# Patient Record
Sex: Male | Born: 1985 | Race: White | Hispanic: No | Smoking: Never smoker
Health system: Southern US, Community
[De-identification: ages and names within clinical notes are randomized; demographics above are authoritative.]

## PROBLEM LIST (undated history)

## (undated) DIAGNOSIS — E7800 Pure hypercholesterolemia, unspecified: Secondary | ICD-10-CM

## (undated) DIAGNOSIS — M545 Low back pain, unspecified: Secondary | ICD-10-CM

## (undated) DIAGNOSIS — Z9989 Dependence on other enabling machines and devices: Secondary | ICD-10-CM

## (undated) DIAGNOSIS — I10 Essential (primary) hypertension: Secondary | ICD-10-CM

## (undated) DIAGNOSIS — G8929 Other chronic pain: Secondary | ICD-10-CM

## (undated) DIAGNOSIS — E119 Type 2 diabetes mellitus without complications: Secondary | ICD-10-CM

## (undated) DIAGNOSIS — F411 Generalized anxiety disorder: Secondary | ICD-10-CM

## (undated) DIAGNOSIS — A4902 Methicillin resistant Staphylococcus aureus infection, unspecified site: Secondary | ICD-10-CM

## (undated) DIAGNOSIS — R000 Tachycardia, unspecified: Secondary | ICD-10-CM

## (undated) DIAGNOSIS — G4733 Obstructive sleep apnea (adult) (pediatric): Secondary | ICD-10-CM

## (undated) HISTORY — DX: Morbid (severe) obesity due to excess calories: E66.01

## (undated) HISTORY — DX: Essential (primary) hypertension: I10

## (undated) HISTORY — DX: Low back pain, unspecified: M54.50

## (undated) HISTORY — DX: Tachycardia, unspecified: R00.0

## (undated) HISTORY — DX: Methicillin resistant Staphylococcus aureus infection, unspecified site: A49.02

## (undated) HISTORY — DX: Generalized anxiety disorder: F41.1

## (undated) HISTORY — DX: Low back pain: M54.5

## (undated) HISTORY — DX: Dependence on other enabling machines and devices: Z99.89

## (undated) HISTORY — DX: Pure hypercholesterolemia, unspecified: E78.00

## (undated) HISTORY — DX: Obstructive sleep apnea (adult) (pediatric): G47.33

## (undated) HISTORY — DX: Type 2 diabetes mellitus without complications: E11.9

## (undated) HISTORY — DX: Other chronic pain: G89.29

---
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
## 2005-01-28 ENCOUNTER — Emergency Department (HOSPITAL_COMMUNITY): Admission: EM | Admit: 2005-01-28 | Discharge: 2005-01-28 | Payer: Self-pay | Admitting: Emergency Medicine

## 2005-08-07 ENCOUNTER — Emergency Department (HOSPITAL_COMMUNITY): Admission: EM | Admit: 2005-08-07 | Discharge: 2005-08-07 | Payer: Self-pay | Admitting: Emergency Medicine

## 2007-03-28 ENCOUNTER — Emergency Department (HOSPITAL_COMMUNITY): Admission: EM | Admit: 2007-03-28 | Discharge: 2007-03-29 | Payer: Self-pay | Admitting: Emergency Medicine

## 2007-08-06 ENCOUNTER — Emergency Department (HOSPITAL_COMMUNITY): Admission: EM | Admit: 2007-08-06 | Discharge: 2007-08-06 | Payer: Self-pay | Admitting: Family Medicine

## 2007-08-15 ENCOUNTER — Emergency Department (HOSPITAL_COMMUNITY): Admission: EM | Admit: 2007-08-15 | Discharge: 2007-08-15 | Payer: Self-pay | Admitting: Emergency Medicine

## 2008-12-17 IMAGING — CR DG CHEST 2V
2 series · 2 of 2 positions shown · non-contrast
Comparison: none

CLINICAL DATA: Left upper chest pain.  Hypertension. 
 CHEST - 2 VIEW:

[w chest pa]
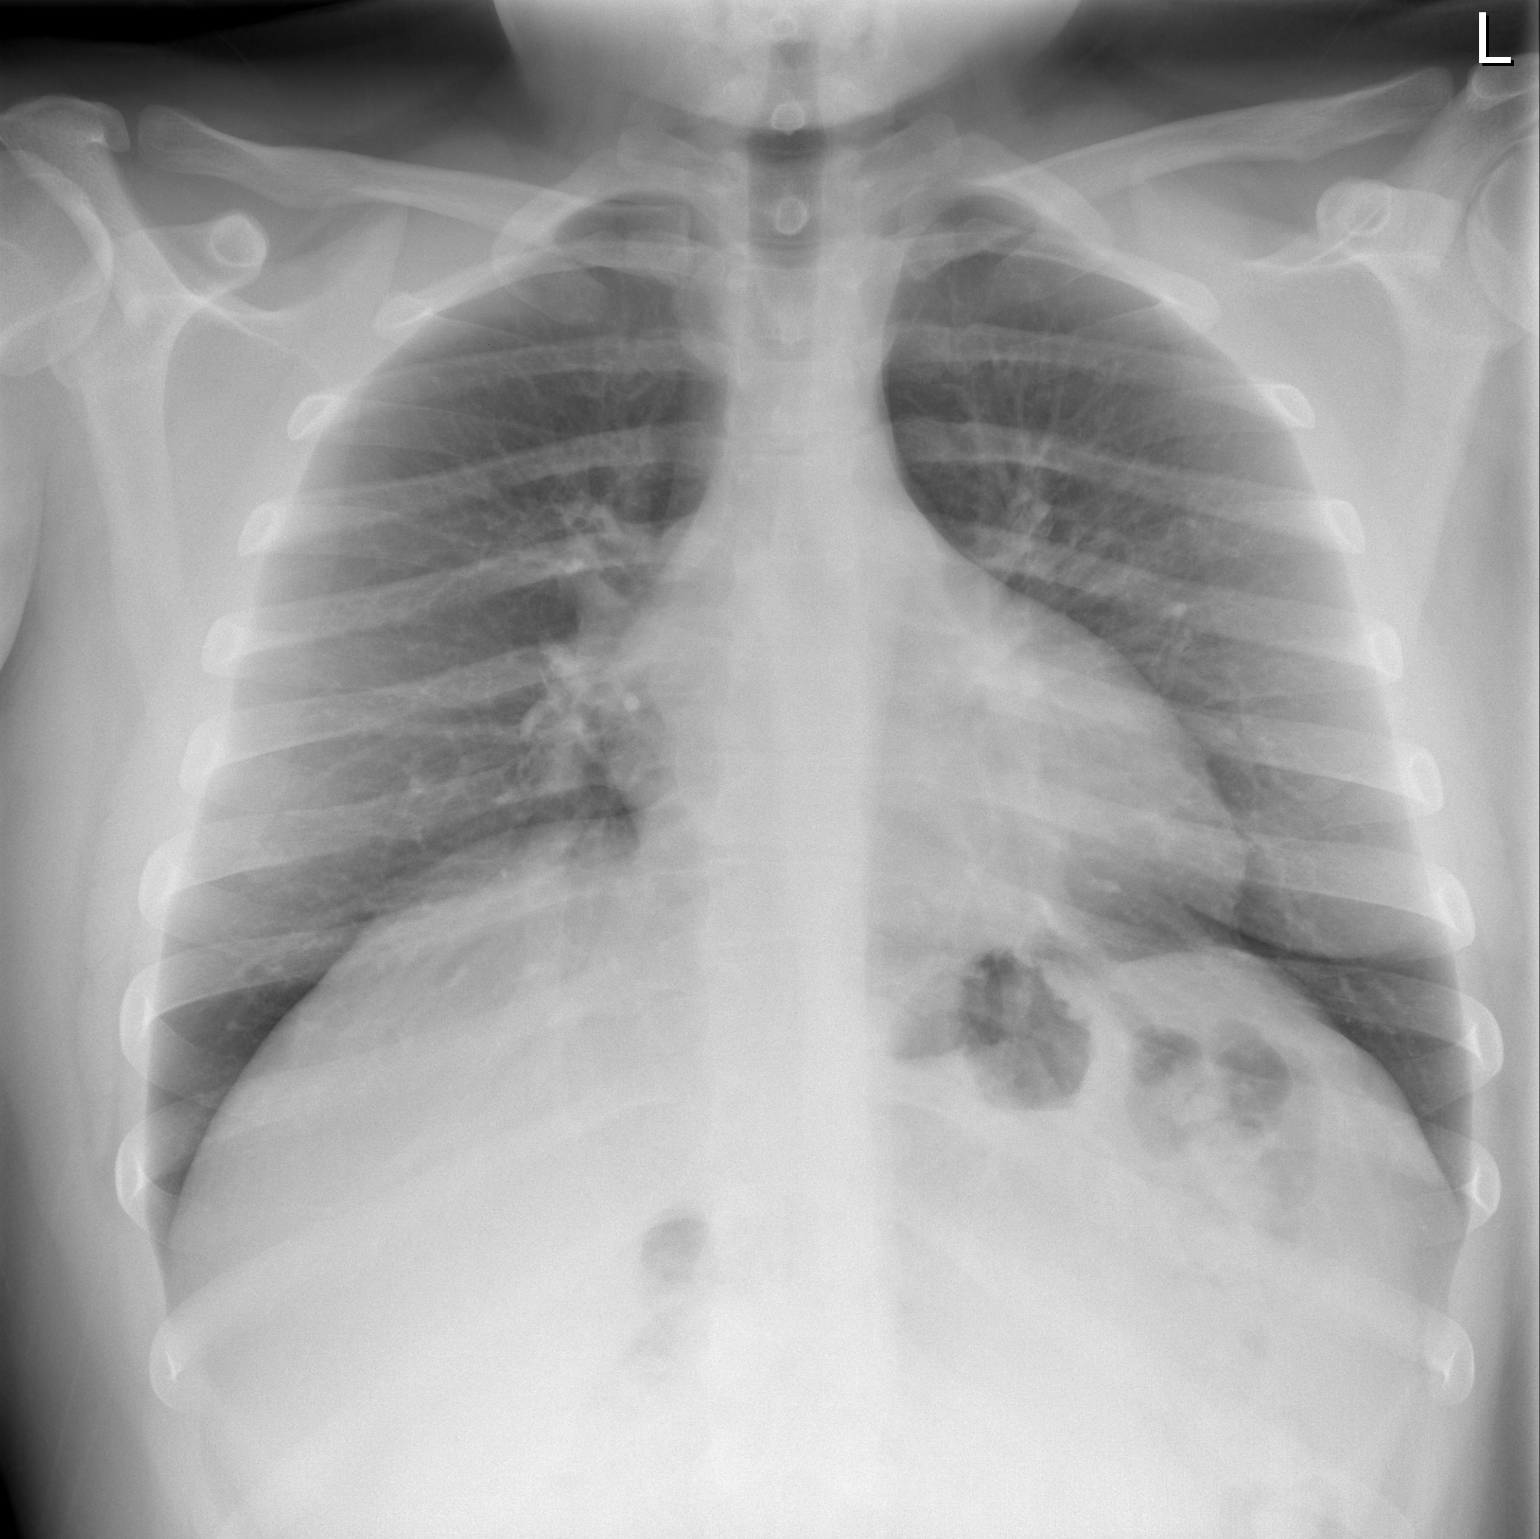

[w chest lat]
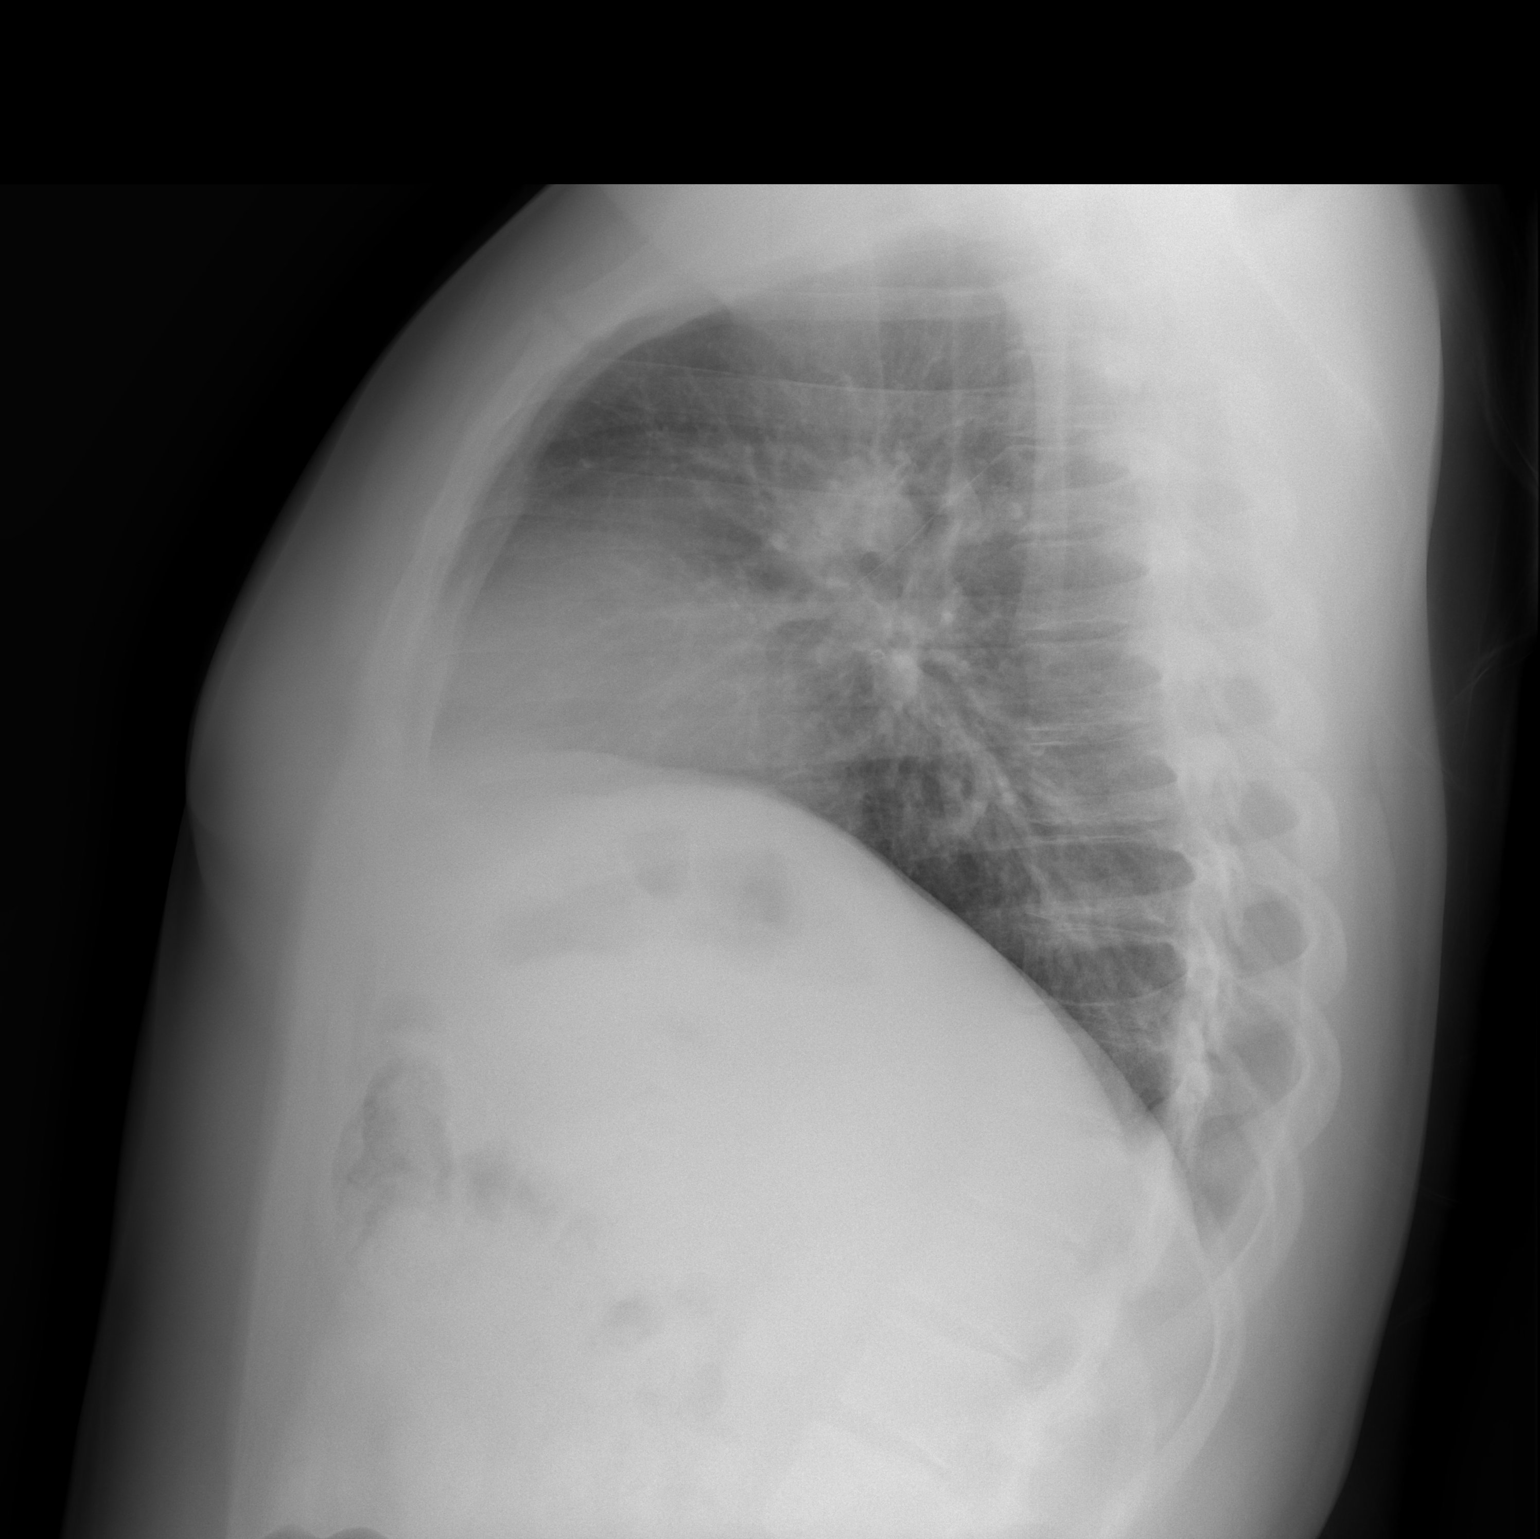

[2 of 2 positions shown; findings below may reference images not displayed]

FINDINGS: Two views of the chest show the lungs to be clear.  Mild peribronchial thickening is noted.  The heart is within normal limits in size.
IMPRESSION: No pneumonia.  Mild peribronchial thickening.

## 2011-02-10 LAB — RAPID STREP SCREEN (MED CTR MEBANE ONLY): Streptococcus, Group A Screen (Direct): NEGATIVE

## 2011-02-10 LAB — URINALYSIS, ROUTINE W REFLEX MICROSCOPIC
Bilirubin Urine: NEGATIVE
Glucose, UA: NEGATIVE
Hgb urine dipstick: NEGATIVE
Ketones, ur: NEGATIVE
Nitrite: NEGATIVE
Protein, ur: NEGATIVE
Specific Gravity, Urine: 1.024
Urobilinogen, UA: 1
pH: 6

## 2011-02-10 LAB — COMPREHENSIVE METABOLIC PANEL
ALT: 25
AST: 18
Albumin: 4.1
Alkaline Phosphatase: 52
BUN: 10
CO2: 28
Calcium: 9.1
Chloride: 100
Creatinine, Ser: 0.91
GFR calc Af Amer: >60
GFR calc non Af Amer: >60
Glucose, Bld: 104 — ABNORMAL HIGH
Potassium: 3.6
Sodium: 136
Total Bilirubin: 0.9
Total Protein: 7.3

## 2011-02-10 LAB — POCT CARDIAC MARKERS
CKMB, poc: <1 — ABNORMAL LOW
Myoglobin, poc: 83.5
Operator id: 1192
Troponin i, poc: <0.05

## 2011-02-10 LAB — CBC
HCT: 40.4
Hemoglobin: 13.9
MCHC: 34.3
MCV: 86.4
Platelets: 338
RBC: 4.67
RDW: 11.9
WBC: 11.1 — ABNORMAL HIGH

## 2011-02-10 LAB — DIFFERENTIAL
Basophils Absolute: 0
Basophils Relative: 0
Eosinophils Absolute: 0.1 — ABNORMAL LOW
Eosinophils Relative: 1
Lymphocytes Relative: 30
Lymphs Abs: 3.3
Monocytes Absolute: 0.8
Monocytes Relative: 7
Neutro Abs: 6.9
Neutrophils Relative %: 61

## 2011-02-10 LAB — D-DIMER, QUANTITATIVE: D-Dimer, Quant: <0.22

## 2011-02-10 LAB — URINE MICROSCOPIC-ADD ON

## 2011-07-15 ENCOUNTER — Ambulatory Visit: Payer: Self-pay | Admitting: Family Medicine

## 2011-07-15 ENCOUNTER — Ambulatory Visit: Payer: Self-pay

## 2011-07-15 VITALS — BP 135/87 | HR 87 | Temp 100.0°F | Resp 16 | Ht 68.75 in | Wt 268.0 lb

## 2011-07-15 DIAGNOSIS — R05 Cough: Secondary | ICD-10-CM

## 2011-07-15 DIAGNOSIS — R059 Cough, unspecified: Secondary | ICD-10-CM

## 2011-07-15 DIAGNOSIS — J029 Acute pharyngitis, unspecified: Secondary | ICD-10-CM

## 2011-07-15 LAB — POCT RAPID STREP A (OFFICE): Rapid Strep A Screen: NEGATIVE

## 2011-07-15 MED ORDER — HYDROCODONE-HOMATROPINE 5-1.5 MG/5ML PO SYRP: 5.0000 mL | ORAL_SOLUTION | Freq: Three times a day (TID) | ORAL | Status: AC | PRN

## 2011-07-15 MED ORDER — CEFDINIR 300 MG PO CAPS: 300.0000 mg | ORAL_CAPSULE | Freq: Two times a day (BID) | ORAL | Status: AC

## 2011-07-15 NOTE — Progress Notes (Signed)
  Patient Name: Eric Hardy Date of Birth: 1985-10-12 Medical Record Number: [id number] Gender: male Date of Encounter: 07/15/2011  History of Present Illness:  Eric Hardy is a 26 y.o. very pleasant male patient who presents with the following:  He has been "coughing up green stuff" for 3 weeks, and he also has a right earache for the last 3 or 4 days.  Also notes severe ST an difficulty swallowing about 2 days ago- his neck/ nodes are also very tender. Had not been aware of any fever- no GI symptoms. His 23 and 19 year old children had strep throat last week.  He is having difficulty swallowing due to pain, but is able to breathe ok.  Otherwise generally healthy  There is no problem list on file for this patient.  History reviewed. No pertinent past medical history. History reviewed. No pertinent past surgical history. History  Substance Use Topics  . Smoking status: Never Smoker   . Smokeless tobacco: Never Used  . Alcohol Use: Yes     socially   History reviewed. No pertinent family history. No Known Allergies  Medication list has been reviewed and updated.  Review of Systems: As per HPI- otherwise negative.   Physical Examination: Filed Vitals:   07/15/11 1346  BP: 135/87  Pulse: 87  Temp: 100 F (37.8 C)  TempSrc: Oral  Resp: 16  Height: 5' 8.75" (1.746 m)  Weight: 268 lb (121.564 kg)    Body mass index is 39.87 kg/(m^2).  GEN: WDWN, NAD, Non-toxic, A & O x 3, obese HEENT: Atraumatic, Normocephalic. Neck supple. No masses, No LAD.  TM wnl bilaterally, enlarged tonsils bilaterally.  Tender cervical and submandibular nodes bilaterally.   His oropharynx is not obstructed.  Ears and Nose: No external deformity. CV: RRR, No M/G/R. No JVD. No thrill. No extra heart sounds. PULM: CTA B, no wheezes, crackles, rhonchi. No retractions. No resp. distress. No accessory muscle use. EXTR: No c/c/e NEURO Normal gait.  PSYCH: Normally interactive. Conversant. Not  depressed or anxious appearing.  Calm demeanor.   Given 400mg  of liquid ibuprofen while at clinic  Results for orders placed in visit on 07/15/11  POCT RAPID STREP A (OFFICE)      Component Value Range   Rapid Strep A Screen Negative  Negative     Assessment and Plan: 1. Sore throat  POCT rapid strep A, cefdinir (OMNICEF) 300 MG capsule, HYDROcodone-homatropine (HYCODAN) 5-1.5 MG/5ML syrup  2. Cough     Suspect that Tyjai may have strep- he also may have a bacterial bronchitis.  Will treat as above with omnicef to cover both.  Did not sent throat culture due to patient finances.  Hycodan for cough and pain.  Patient (or parent if minor) instructed to return to clinic or call if not better in 2 day(s).  If he has any trouble breathing due to throat swelling he is to proceed to the ER or call 911- he agrees

## 2012-05-20 ENCOUNTER — Encounter (HOSPITAL_COMMUNITY): Payer: Self-pay | Admitting: *Deleted

## 2012-05-20 ENCOUNTER — Emergency Department (HOSPITAL_COMMUNITY)
Admission: EM | Admit: 2012-05-20 | Discharge: 2012-05-20 | Disposition: A | Discharge status: Home / Self Care | Payer: Self-pay | Attending: Emergency Medicine | Admitting: Emergency Medicine

## 2012-05-20 DIAGNOSIS — M79609 Pain in unspecified limb: Secondary | ICD-10-CM | POA: Insufficient documentation

## 2012-05-20 DIAGNOSIS — M79661 Pain in right lower leg: Secondary | ICD-10-CM

## 2012-05-20 DIAGNOSIS — IMO0001 Reserved for inherently not codable concepts without codable children: Secondary | ICD-10-CM | POA: Insufficient documentation

## 2012-05-20 DIAGNOSIS — M7989 Other specified soft tissue disorders: Secondary | ICD-10-CM | POA: Insufficient documentation

## 2012-05-20 LAB — CBC WITH DIFFERENTIAL/PLATELET
Basophils Absolute: 0 10*3/uL (ref 0.0–0.1)
Basophils Relative: 0 % (ref 0–1)
Eosinophils Absolute: 0.1 10*3/uL (ref 0.0–0.7)
Eosinophils Relative: 1 % (ref 0–5)
HCT: 41.2 % (ref 39.0–52.0)
Hemoglobin: 14.3 g/dL (ref 13.0–17.0)
Lymphocytes Relative: 49 % — ABNORMAL HIGH (ref 12–46)
Lymphs Abs: 3.3 10*3/uL (ref 0.7–4.0)
MCH: 28.7 pg (ref 26.0–34.0)
MCHC: 34.7 g/dL (ref 30.0–36.0)
MCV: 82.6 fL (ref 78.0–100.0)
Monocytes Absolute: 0.6 10*3/uL (ref 0.1–1.0)
Monocytes Relative: 9 % (ref 3–12)
Neutro Abs: 2.8 10*3/uL (ref 1.7–7.7)
Neutrophils Relative %: 41 % — ABNORMAL LOW (ref 43–77)
Platelets: 234 10*3/uL (ref 150–400)
RBC: 4.99 MIL/uL (ref 4.22–5.81)
RDW: 12.6 % (ref 11.5–15.5)
WBC: 6.7 10*3/uL (ref 4.0–10.5)

## 2012-05-20 LAB — BASIC METABOLIC PANEL
BUN: 13 mg/dL (ref 6–23)
CO2: 28 mEq/L (ref 19–32)
Calcium: 9 mg/dL (ref 8.4–10.5)
Chloride: 98 mEq/L (ref 96–112)
Creatinine, Ser: 0.87 mg/dL (ref 0.50–1.35)
GFR calc Af Amer: >90 mL/min (ref >90)
GFR calc non Af Amer: >90 mL/min (ref >90)
Glucose, Bld: 100 mg/dL — ABNORMAL HIGH (ref 70–99)
Potassium: 3.7 mEq/L (ref 3.5–5.1)
Sodium: 135 mEq/L (ref 135–145)

## 2012-05-20 LAB — D-DIMER, QUANTITATIVE: D-Dimer, Quant: <0.27 ug/mL-FEU (ref 0.00–0.48)

## 2012-05-20 MED ORDER — OXYCODONE-ACETAMINOPHEN 5-325 MG PO TABS: 2.0000 | ORAL_TABLET | ORAL | Status: DC | PRN

## 2012-05-20 NOTE — ED Provider Notes (Signed)
Medical screening examination/treatment/procedure(s) were performed by non-physician practitioner and as supervising physician I was immediately available for consultation/collaboration.   Glynn Octave, MD 05/20/12 [phone]

## 2012-05-20 NOTE — ED Provider Notes (Signed)
History     CSN: [id number]  Arrival date & time 05/20/12  0736   First MD Initiated Contact with Patient 05/20/12 0740      Chief Complaint  Patient presents with  . Leg Pain    (Consider location/radiation/quality/duration/timing/severity/associated sxs/prior treatment) HPI Comments: Patient is a 27 year old male who presents with a 1 year history of right calf pain. The pain started gradually and progressively worsened since the onset. The pain is now constant with episodes of intermittent worsening. The pain is dull, aching, and moderate. The pain does not radiate. He has not tried anything for pain relief. Being on his feet all day make the calf pain worse. Nothing makes the pain better. Associated symptoms include right lower leg swelling. Patient denies SOB, chest pain, exogenous hormones, recent surgery or trauma, recent travel, previous DVT/PE and smoking.   Patient is a 27 y.o. male presenting with leg pain.  Leg Pain     No past medical history on file.  No past surgical history on file.  No family history on file.  History  Substance Use Topics  . Smoking status: Never Smoker   . Smokeless tobacco: Never Used  . Alcohol Use: Yes     Comment: socially      Review of Systems  Musculoskeletal: Positive for myalgias and joint swelling.  All other systems reviewed and are negative.    Allergies  Review of patient's allergies indicates no known allergies.  Home Medications  No current outpatient prescriptions on file.  BP 148/86  Pulse 96  Temp 97.9 F (36.6 C) (Oral)  Resp 16  Ht 5\' 11"  (1.803 m)  Wt 300 lb (136.079 kg)  BMI 41.84 kg/m2  SpO2 100%  Physical Exam  Nursing note and vitals reviewed. Constitutional: He is oriented to person, place, and time. He appears well-developed and well-nourished. No distress.  HENT:  Head: Normocephalic and atraumatic.  Eyes: Conjunctivae normal are normal.  Neck: Normal range of motion.  Cardiovascular:  Normal rate, regular rhythm and intact distal pulses.  Exam reveals no gallop and no friction rub.   No murmur heard. Pulmonary/Chest: Effort normal and breath sounds normal. He has no wheezes. He has no rales. He exhibits no tenderness.  Abdominal: Soft. There is no tenderness.  Musculoskeletal: Normal range of motion.       Mild right lower extremity non pitting edema noted. Focal right calf tenderness. No popliteal tenderness.   Neurological: He is alert and oriented to person, place, and time.       Lower extremity strength and sensation equal and intact bilaterally. Speech is goal-oriented. Moves limbs without ataxia.   Skin: Skin is warm and dry.  Psychiatric: He has a normal mood and affect. His behavior is normal.    ED Course  Procedures (including critical care time)  Labs Reviewed  CBC WITH DIFFERENTIAL - Abnormal; Notable for the following:    Neutrophils Relative 41 (*)     Lymphocytes Relative 49 (*)     All other components within normal limits  BASIC METABOLIC PANEL - Abnormal; Notable for the following:    Glucose, Bld 100 (*)     All other components within normal limits  D-DIMER, QUANTITATIVE   No results found.   1. Pain of right lower leg       MDM  8:40 AM Basic labs and d-dimer pending. Patient's leg has focal calf tenderness and right leg swelling. If d-dimer is negative, patient can be discharged  without further evaluation. If positive, I will order a venous doppler.   9:22 AM D-dimer negative. Patient having a DVT is unlikely. Patient will be discharged without further evaluation. Patient will have Percocet for pain. Patient instructed to return with worsening or concerning symptoms. Patient given return precautions.        Emilia Beck, [place] 05/20/12 [phone]

## 2012-05-20 NOTE — ED Notes (Signed)
Pt states that over the last year he has been having R leg pain in his calf with swelling.  He states that she pain is increased with rest.  He works [employer] denies any trauma.  He states that he has not been evaluated for this but now the pain has become more "annoying".  Denies any CP or SOB.  Ambulatory without difficulty.

## 2012-05-20 NOTE — ED Notes (Signed)
NAD noted at time of d/c home 

## 2012-06-26 ENCOUNTER — Ambulatory Visit: Payer: Self-pay | Admitting: Family Medicine

## 2012-06-26 VITALS — BP 130/80 | HR 111 | Temp 98.0°F | Resp 17 | Ht 70.0 in | Wt 284.0 lb

## 2012-06-26 DIAGNOSIS — H60399 Other infective otitis externa, unspecified ear: Secondary | ICD-10-CM

## 2012-06-26 MED ORDER — CIPROFLOXACIN-DEXAMETHASONE 0.3-0.1 % OT SUSP: 4.0000 [drp] | Freq: Two times a day (BID) | OTIC | Status: DC

## 2012-06-26 NOTE — Progress Notes (Signed)
  Subjective:    Patient ID: Eric Hardy, male    DOB: 1985/06/26, 27 y.o.   MRN: [id number]  HPI  3 wks of feeling like somehting was in his ear - like water - and had a dull hearing so has been using ring-relief gtts and hydrogen peroxide w/o relief, trying to clean ear out with qtip w/o success. Then 3d ago hearing got very muffled.  History reviewed. No pertinent past medical history. No current outpatient prescriptions on file prior to visit.   No current facility-administered medications on file prior to visit.   No Known Allergies   Review of Systems  Constitutional: Negative for fever, chills, diaphoresis, activity change, appetite change and fatigue.  HENT: Positive for hearing loss, ear pain and ear discharge. Negative for congestion, sore throat, facial swelling, rhinorrhea, sneezing, trouble swallowing, neck pain, neck stiffness, dental problem, voice change, postnasal drip, sinus pressure and tinnitus.   Skin: Negative for color change and rash.  Neurological: Negative for dizziness, facial asymmetry, light-headedness and headaches.  Hematological: Negative for adenopathy.      BP 130/80  Pulse 111  Temp(Src) 98 F (36.7 C) (Oral)  Resp 17  Ht 5\' 10"  (1.778 m)  Wt 284 lb (128.822 kg)  BMI 40.75 kg/m2  SpO2 99% Objective:   Physical Exam  Constitutional: He is oriented to person, place, and time. He appears well-developed and well-nourished. No distress.  HENT:  Head: Normocephalic and atraumatic.  Right Ear: Hearing, tympanic membrane, external ear and ear canal normal.  Left Ear: External ear normal. Decreased hearing is noted.  Left canal completely occluded with thick white purulent material and in center of this is dried white/yellow crust  Eyes: No scleral icterus.  Pulmonary/Chest: Effort normal.  Neurological: He is alert and oriented to person, place, and time.  Skin: Skin is warm and dry. He is not diaphoretic.  Psychiatric: He has a normal mood  and affect. His behavior is normal.      Assessment & Plan:  Otitis, externa, infective - attempted to place ear wick but pt's ear canals are quite large and the exudate was very thick so could not introduce wick to any sig depth - cortisporin gtt applied to wick in office but i suspect wick will fall out momentarily.  I cannot tell if he packed something down into his ear with a qtip or if there is just a large amount of dried exudate. Start ciprodex gtts and hopefully at f/u we will be able to see enough to determine if these was TM rupture - if not, rec lavage at f/u in 1-2d.  Meds ordered this encounter  Medications  . ciprofloxacin-dexamethasone (CIPRODEX) otic suspension    Sig: Place 4 drops into the left ear 2 (two) times daily.    Dispense:  7.5 mL    Refill:  0

## 2012-06-26 NOTE — Patient Instructions (Signed)
Otitis Externa Otitis externa is a bacterial or fungal infection of the outer ear canal. This is the area from the eardrum to the outside of the ear. Otitis externa is sometimes called "swimmer's ear." CAUSES  Possible causes of infection include:  Swimming in dirty water.  Moisture remaining in the ear after swimming or bathing.  Mild injury (trauma) to the ear.  Objects stuck in the ear (foreign body).  Cuts or scrapes (abrasions) on the outside of the ear. SYMPTOMS  The first symptom of infection is often itching in the ear canal. Later signs and symptoms may include swelling and redness of the ear canal, ear pain, and yellowish-white fluid (pus) coming from the ear. The ear pain may be worse when pulling on the earlobe. DIAGNOSIS  Your caregiver will perform a physical exam. A sample of fluid may be taken from the ear and examined for bacteria or fungi. TREATMENT  Antibiotic ear drops are often given for 10 to 14 days. Treatment may also include pain medicine or corticosteroids to reduce itching and swelling. PREVENTION   Keep your ear dry. Use the corner of a towel to absorb water out of the ear canal after swimming or bathing.  Avoid scratching or putting objects inside your ear. This can damage the ear canal or remove the protective wax that lines the canal. This makes it easier for bacteria and fungi to grow.  Avoid swimming in lakes, polluted water, or poorly chlorinated pools.  You may use ear drops made of rubbing alcohol and vinegar after swimming. Combine equal parts of white vinegar and alcohol in a bottle. Put 3 or 4 drops into each ear after swimming. HOME CARE INSTRUCTIONS   Apply antibiotic ear drops to the ear canal as prescribed by your caregiver.  Only take over-the-counter or prescription medicines for pain, discomfort, or fever as directed by your caregiver.  If you have diabetes, follow any additional treatment instructions from your caregiver.  Keep all  follow-up appointments as directed by your caregiver. SEEK MEDICAL CARE IF:   You have a fever.  Your ear is still red, swollen, painful, or draining pus after 3 days.  Your redness, swelling, or pain gets worse.  You have a severe headache.  You have redness, swelling, pain, or tenderness in the area behind your ear. MAKE SURE YOU:   Understand these instructions.  Will watch your condition.  Will get help right away if you are not doing well or get worse. Document Released: 04/20/2005 Document Revised: 07/13/2011 Document Reviewed: 05/07/2011 Exit[employer] Patient Information 2013 [employer].  

## 2012-06-29 ENCOUNTER — Ambulatory Visit: Payer: Self-pay | Admitting: Family Medicine

## 2012-06-29 VITALS — BP 149/89 | HR 99 | Temp 98.5°F | Resp 18 | Ht 70.0 in | Wt 285.0 lb

## 2012-06-29 DIAGNOSIS — H60392 Other infective otitis externa, left ear: Secondary | ICD-10-CM

## 2012-06-29 DIAGNOSIS — H66019 Acute suppurative otitis media with spontaneous rupture of ear drum, unspecified ear: Secondary | ICD-10-CM

## 2012-06-29 DIAGNOSIS — H60399 Other infective otitis externa, unspecified ear: Secondary | ICD-10-CM

## 2012-06-29 MED ORDER — OFLOXACIN 0.3 % OT SOLN: 10.0000 [drp] | Freq: Every day | OTIC | Status: DC

## 2012-06-29 MED ORDER — AMOXICILLIN 500 MG PO CAPS: 1000.0000 mg | ORAL_CAPSULE | Freq: Two times a day (BID) | ORAL | Status: DC

## 2012-06-29 NOTE — Patient Instructions (Addendum)
Please stop using the ciprodex drops and start the ofloxacin drops and amoxicillin pills. On March 1st I will refer you to ENT to look at your ear drum.  If you are getting worse or having any increased pain or other problems in the meantime please call or come back in

## 2012-06-29 NOTE — Progress Notes (Signed)
[facility] [address], [place] [address] [phone]  Date:  06/29/2012   Name:  Eric Hardy   DOB:  Jul 28, 1985   MRN:  [id number]  PCP:  No primary provider on file.    Chief Complaint: Follow-up   History of Present Illness:  Eric Hardy is a 27 y.o. very pleasant male patient who presents with the following:  He was here 3 days ago with a feeling of water in his ear- he was noted to have severe left OE.  Started ciprodex, here to recheck and evaluate for TM rupture.   He notes that his hearing has been very reduced for about 1 month.  He cannot really remember how it started- he did have some pain he thinks but no bleeding that he can recall. He does use qtips in his ears but does not recall noting any pain or particular trauma He states his ear is "same as it was."  He "can't hear nothing," but it does not hurt.  He has been applying his drops.   Otherwise he feels well.   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    Eric Hardy does not currently have insurance coverage but has purchased a Insurance underwriter which will take effect on 07/02/12  There is no problem list on file for this patient.   No past medical history on file.  No past surgical history on file.  History  Substance Use Topics  . Smoking status: Never Smoker   . Smokeless tobacco: Never Used  . Alcohol Use: Yes     Comment: socially    Family History  Problem Relation Age of Onset  . Hypertension Maternal Grandmother   . Heart attack Maternal Grandfather   . Hypertension Paternal Grandmother   . Hypertension Paternal Grandfather     No Known Allergies  Medication list has been reviewed and updated.  Current Outpatient Prescriptions on File Prior to Visit  Medication Sig Dispense Refill  . ciprofloxacin-dexamethasone (CIPRODEX) otic suspension Place 4 drops into  the left ear 2 (two) times daily.  7.5 mL  0   No current facility-administered medications on file prior to visit.    Review of Systems:  As per HPI- otherwise negative.   Physical Examination: Filed Vitals:   06/29/12 0815  BP: 149/89  Pulse: 99  Temp: 98.5 F (36.9 C)  Resp: 18   Filed Vitals:   06/29/12 0815  Height: 5\' 10"  (1.778 m)  Weight: 285 lb (129.275 kg)   Body mass index is 40.89 kg/(m^2). Ideal Body Weight: Weight in (lb) to have BMI = 25: 173.9  GEN: WDWN, NAD, Non-toxic, A & O x 3, obese HEENT: Atraumatic, Normocephalic. Neck supple. No masses, No LAD.  Oropharynx wnl, PEERL Ears and Nose: No external deformity. CV: RRR, No M/G/R. No JVD. No thrill. No extra heart sounds. PULM: CTA B, no wheezes, crackles, rhonchi. No retractions. No resp. distress. No accessory muscle use. EXTR: No c/c/e NEURO Normal gait.  PSYCH: Normally interactive. Conversant. Not depressed or anxious appearing.  Calm demeanor.  Right ear: TM and canal normal, some cerumen.  He does not hear well from his right ear.   Left ear: canal occluded by thick white material which is similar in appearance to old moist cotton- however it is not cotton and cannot be removed  with alligator forceps as it is too friable.  Removed a moderate amount of debris with a curette- TM appears to be ruptured.  There is still discharge within the ear canal which is cream colored and thin.  No swelling of the canal walls.  He cannot hear well from his left ear either  Assessment and Plan: Otitis, externa, infective, left - Plan: ofloxacin (FLOXIN) 0.3 % otic solution  Otitis media, purulent, acute, with spontaneous rupture of TM, left - Plan: amoxicillin (AMOXIL) 500 MG capsule TM rupture of uncertain etiology. Will d/c corticosporin drops, start floxin otic and po amoxicillin.  It seems that his TM may have been ruptured for a month or more per his history.  At this point he needs ENT evaluation will do this on  3/1/ when his insurance coverage begins.  Keep water out of ear.  If any change or worsening in the meantime please let me know   Abbe Amsterdam, MD

## 2012-07-04 ENCOUNTER — Telehealth: Payer: Self-pay | Admitting: Family Medicine

## 2012-07-04 DIAGNOSIS — H7290 Unspecified perforation of tympanic membrane, unspecified ear: Secondary | ICD-10-CM

## 2012-07-04 NOTE — Telephone Encounter (Signed)
Not a phone call- did ENT referral

## 2012-07-04 NOTE — Telephone Encounter (Signed)
Message copied by Pearline Cables on Mon Jul 04, 2012  8:01 PM ------      Message from: Abbe Amsterdam C      Created: Wed Jun 29, 2012  8:58 AM       First of march do an ENT referral ------

## 2012-08-19 ENCOUNTER — Ambulatory Visit (HOSPITAL_BASED_OUTPATIENT_CLINIC_OR_DEPARTMENT_OTHER): Payer: BC Managed Care – PPO | Attending: Physician Assistant

## 2012-08-19 VITALS — Ht 70.0 in | Wt 285.0 lb

## 2012-08-19 DIAGNOSIS — G4733 Obstructive sleep apnea (adult) (pediatric): Secondary | ICD-10-CM

## 2012-08-20 DIAGNOSIS — G4733 Obstructive sleep apnea (adult) (pediatric): Secondary | ICD-10-CM

## 2012-08-20 DIAGNOSIS — R0609 Other forms of dyspnea: Secondary | ICD-10-CM

## 2012-08-20 DIAGNOSIS — R0989 Other specified symptoms and signs involving the circulatory and respiratory systems: Secondary | ICD-10-CM

## 2012-08-20 NOTE — Procedures (Signed)
NAMERISHIT, BURKHALTER NO.:  [id number]  MEDICAL RECORD NO.:  [id number]          PATIENT TYPE:  OUT  LOCATION:  SLEEP CENTER                 FACILITY:  [facility]  PHYSICIAN:  Chance Munter D. Maple Hudson, MD, FCCP, FACPDATE OF BIRTH:  02/21/1985  DATE OF STUDY:  08/19/2012                           NOCTURNAL POLYSOMNOGRAM  REFERRING PHYSICIAN:  LOUISE NORDBLADH  REFERRING PHYSICIAN:  Aquilla Hacker, PA-C  INDICATION FOR STUDY:  Hypersomnia with sleep apnea.  EPWORTH SLEEPINESS SCORE:  13/24.  BMI 41, weight 285 pounds, height 70 inches, neck 19 inches.  MEDICATIONS:  Home medications charted and reviewed.  SLEEP ARCHITECTURE:  Total sleep time 421 minutes with sleep efficiency 94%.  Stage I was 7.6%, stage II 84.9%, stage III absent, REM 7.5% of total sleep time.  Sleep latency 3.5 minutes, awake after sleep onset 23.5 minutes.  Arousal index 8.  Bedtime medication:  None.  RESPIRATORY DATA:  Apnea/hypopnea index (AHI) 130.4 per hour.  A total of 915 events were scored, including 748 obstructive apneas, 11 central apneas, 45 mixed apneas, 111 hypopneas.  Events were not positional. REM AHI 131.4 per hour.  This was a diagnostic NPSG protocol as ordered. CPAP was not done.  OXYGEN DATA:  Extremely loud snoring with oxygen desaturation to a nadir of 71% and mean oxygen saturation through the study of 90.2% on room air.  CARDIAC DATA:  Normal sinus rhythm.  MOVEMENT/PARASOMNIA:  No significant movement disturbance.  No bathroom trips.  IMPRESSION/RECOMMENDATION: 1. Very severe obstructive sleep apnea/hypopnea syndrome, AHI 130.4     per hour with non-positional events.  Very loud snoring with oxygen     desaturation to a nadir of 71% and mean oxygen saturation through     the study of 90.2% on room air. 2. Oxygen saturation on arrival while awake and upright was only 90%     suggesting possible underlying cardiopulmonary disease. 3. This study was performed as a  diagnostic NPSG protocol as ordered.     Consider early return for a CPAP titration study or evaluate for     alternative management as clinically appropriate.     Gyselle Matthew D. Maple Hudson, MD, [facility], FACP Diplomate, American Board of Sleep Medicine    CDY/MEDQ  D:  08/20/2012 11:05:13  T:  08/20/2012 11:21:35  Job:  [id number]

## 2012-08-22 ENCOUNTER — Encounter: Payer: Self-pay | Admitting: Family Medicine

## 2012-08-22 ENCOUNTER — Telehealth: Payer: Self-pay | Admitting: Radiology

## 2012-08-22 NOTE — Telephone Encounter (Signed)
From chart review, we did send to ENT< he may have ordered the study. Left message for patient so I can speak to him about this.

## 2012-08-22 NOTE — Telephone Encounter (Signed)
Spoke to him, he states the ENT Dr did order this for him. He states he does not have any results back yet, or any treatment. He states he is due to return to the [facility] for part 2 of the sleep study, he states he will come back here, he states Dr Clelia Croft ordered the ENT eval, and he wants to come back to see her to discuss. He is transferred to make appt with her.

## 2012-08-22 NOTE — Telephone Encounter (Signed)
Message copied by Caffie Damme on Mon Aug 22, 2012  2:16 PM ------      Message from: Abbe Amsterdam C      Created: Sun Aug 21, 2012  7:46 PM       I received his sleep study results. I did not order this study, but it appears that he has severe sleep apnea and needs follow-up.            Please give him a call and make sure that he has plans for arranging his CPAP.  The sleep doctor was also concerned about low oxygen at baseline that also needs follow- up- please ask him to come and see [place] or the doctor of his choice regarding these issues and send me a message back- thanks! ------

## 2012-08-22 NOTE — Telephone Encounter (Signed)
At sleep study, his O2 sat was 90% when he was awake and upright so they rec an cardiopulmonary eval.  O2 sat has always been 99% in our office x 2 this yr.  He is returning for cpap tritration and may need further nocturnal continuous O2 sat testing after he starts on his cpap.  Dr. Dallas Schimke or I can discuss w/ pt at his next visit.

## 2012-08-23 ENCOUNTER — Ambulatory Visit: Payer: BC Managed Care – PPO

## 2012-08-23 ENCOUNTER — Ambulatory Visit (INDEPENDENT_AMBULATORY_CARE_PROVIDER_SITE_OTHER): Payer: BC Managed Care – PPO | Admitting: Family Medicine

## 2012-08-23 VITALS — BP 119/89 | HR 106 | Temp 98.5°F | Resp 16 | Ht 70.0 in | Wt 287.0 lb

## 2012-08-23 DIAGNOSIS — F410 Panic disorder [episodic paroxysmal anxiety] without agoraphobia: Secondary | ICD-10-CM

## 2012-08-23 DIAGNOSIS — R0600 Dyspnea, unspecified: Secondary | ICD-10-CM

## 2012-08-23 DIAGNOSIS — F411 Generalized anxiety disorder: Secondary | ICD-10-CM

## 2012-08-23 DIAGNOSIS — R0981 Nasal congestion: Secondary | ICD-10-CM

## 2012-08-23 DIAGNOSIS — M5430 Sciatica, unspecified side: Secondary | ICD-10-CM

## 2012-08-23 DIAGNOSIS — J351 Hypertrophy of tonsils: Secondary | ICD-10-CM

## 2012-08-23 DIAGNOSIS — R0689 Other abnormalities of breathing: Secondary | ICD-10-CM

## 2012-08-23 DIAGNOSIS — J3489 Other specified disorders of nose and nasal sinuses: Secondary | ICD-10-CM

## 2012-08-23 DIAGNOSIS — R0609 Other forms of dyspnea: Secondary | ICD-10-CM

## 2012-08-23 DIAGNOSIS — R0989 Other specified symptoms and signs involving the circulatory and respiratory systems: Secondary | ICD-10-CM

## 2012-08-23 DIAGNOSIS — M5416 Radiculopathy, lumbar region: Secondary | ICD-10-CM

## 2012-08-23 DIAGNOSIS — IMO0002 Reserved for concepts with insufficient information to code with codable children: Secondary | ICD-10-CM

## 2012-08-23 DIAGNOSIS — G4733 Obstructive sleep apnea (adult) (pediatric): Secondary | ICD-10-CM

## 2012-08-23 LAB — POCT CBC
Granulocyte percent: 46.5 %G (ref 37–80)
HCT, POC: 46.6 % (ref 43.5–53.7)
Hemoglobin: 15.5 g/dL (ref 14.1–18.1)
Lymph, poc: 5 — AB (ref 0.6–3.4)
MCH, POC: 29.2 pg (ref 27–31.2)
MCHC: 33.3 g/dL (ref 31.8–35.4)
MCV: 87.9 fL (ref 80–97)
MID (cbc): 1.3 — AB (ref 0–0.9)
MPV: 8.9 fL (ref 0–99.8)
POC Granulocyte: 5.4 (ref 2–6.9)
POC LYMPH PERCENT: 42.6 %L (ref 10–50)
POC MID %: 10.9 %M (ref 0–12)
Platelet Count, POC: 400 10*3/uL (ref 142–424)
RBC: 5.3 M/uL (ref 4.69–6.13)
RDW, POC: 13.3 %
WBC: 11.7 10*3/uL — AB (ref 4.6–10.2)

## 2012-08-23 LAB — COMPREHENSIVE METABOLIC PANEL
ALT: 31 U/L (ref 0–53)
AST: 19 U/L (ref 0–37)
Albumin: 4.6 g/dL (ref 3.5–5.2)
Alkaline Phosphatase: 59 U/L (ref 39–117)
BUN: 16 mg/dL (ref 6–23)
CO2: 31 mEq/L (ref 19–32)
Calcium: 9.4 mg/dL (ref 8.4–10.5)
Chloride: 101 mEq/L (ref 96–112)
Creat: 1.04 mg/dL (ref 0.50–1.35)
Glucose, Bld: 96 mg/dL (ref 70–99)
Potassium: 4.1 mEq/L (ref 3.5–5.3)
Sodium: 139 mEq/L (ref 135–145)
Total Bilirubin: 0.5 mg/dL (ref 0.3–1.2)
Total Protein: 7.7 g/dL (ref 6.0–8.3)

## 2012-08-23 LAB — PULMONARY FUNCTION TEST

## 2012-08-23 LAB — POCT SEDIMENTATION RATE: POCT SED RATE: 10 mm/hr (ref 0–22)

## 2012-08-23 LAB — TSH: TSH: 1.476 u[IU]/mL (ref 0.350–4.500)

## 2012-08-23 MED ORDER — CITALOPRAM HYDROBROMIDE 10 MG PO TABS: 10.0000 mg | ORAL_TABLET | Freq: Every day | ORAL | Status: DC

## 2012-08-23 MED ORDER — PREDNISONE 20 MG PO TABS: ORAL_TABLET | ORAL | Status: DC

## 2012-08-23 MED ORDER — DIAZEPAM 5 MG PO TABS: 5.0000 mg | ORAL_TABLET | Freq: Two times a day (BID) | ORAL | Status: DC | PRN

## 2012-08-23 MED ORDER — ALBUTEROL SULFATE (2.5 MG/3ML) 0.083% IN NEBU
5.0000 mg | INHALATION_SOLUTION | Freq: Once | RESPIRATORY_TRACT | Status: AC
  Administered 2012-08-23: 5 mg via RESPIRATORY_TRACT

## 2012-08-23 NOTE — Progress Notes (Signed)
Subjective:    Patient ID: Eric Hardy, male    DOB: 02-12-1986, 27 y.o.   MRN: [id number] Chief Complaint  Patient presents with  . Sleep Apnea   HPI Can't even catch his breath, taking a lot of short breath but even at rest just feels short or breath.  All the time he is worried about stuff Wonders if he could have ashtma - his 2 children due. He just got fulltijme care of his kids and new job. Never been on a prev med for anxiety - has been at [facility] several times w/ chest pain radiating down arm and always told him he has anxiety.  Now happening a ton - having chest pains constantly.  Has never used an inhaler before.   Did have an echo once prior. Does get lower ext edema. Was seen at [facility] for back problems and legs would get cold, numb, tingling, falling asleep and had xray which showed no DDD.  Still having that prob for the past 6-8 yrs - was lifting weights and felt like hot needle into low back beltline.  At times, will just roll over and have severe pain - can't move x 30 min. Doesn't have time for PT.  Really needs something during the day - back pain, leg pain, chest pain, anxiety.  Did not feel any   History reviewed. No pertinent past medical history. No current outpatient prescriptions on file prior to visit.   No current facility-administered medications on file prior to visit.   No Known Allergies   Review of Systems     BP 119/89  Pulse 106  Temp(Src) 98.5 F (36.9 C) (Oral)  Resp 16  Ht 5\' 10"  (1.778 m)  Wt 287 lb (130.182 kg)  BMI 41.18 kg/m2  SpO2 98% Objective:   Physical Exam  Constitutional:  Audible mouth breathing  HENT:  Right Ear: Tympanic membrane is injected and retracted. Tympanic membrane is not perforated. A middle ear effusion is present.  Left Ear: Tympanic membrane is injected, scarred and retracted. Tympanic membrane is not perforated.  Nose: No mucosal edema or rhinorrhea.  Mouth/Throat: Uvula is midline and mucous membranes  are normal. Posterior oropharyngeal edema present. No oropharyngeal exudate, posterior oropharyngeal erythema or tonsillar abscesses.  3+ bilateral tonsillar hypertrophy - both touching uvula.  Pulmonary/Chest:  Bibasilar end-exp wheezing with forced expiration.       Results for orders placed in visit on 08/23/12  POCT CBC      Result Value Range   WBC 11.7 (*) 4.6 - 10.2 K/uL   Lymph, poc 5.0 (*) 0.6 - 3.4   POC LYMPH PERCENT 42.6  10 - 50 %L   MID (cbc) 1.3 (*) 0 - 0.9   POC MID % 10.9  0 - 12 %M   POC Granulocyte 5.4  2 - 6.9   Granulocyte percent 46.5  37 - 80 %G   RBC 5.30  4.69 - 6.13 M/uL   Hemoglobin 15.5  14.1 - 18.1 g/dL   HCT, POC 95.2  84.1 - 53.7 %   MCV 87.9  80 - 97 fL   MCH, POC 29.2  27 - 31.2 pg   MCHC 33.3  31.8 - 35.4 g/dL   RDW, POC 32.4     Platelet Count, POC 400  142 - 424 K/uL   MPV 8.9  0 - 99.8 fL   [facility] reading (PRIMARY) by  Dr. Clelia Croft. EKG: NSR, no ischemic changes at this time. CXR:  Enlarged cardiac sillhouette and mediastinal widening but poor insp effort PFTs pre- and post- alb neb -   Assessment & Plan:  Trial of prednisone for breathing and back. Start prn inhaler Start ssri w/ benzo for sleep F/u for cpap titration and get machine asap Consider referral back to ent for eval for tonsillectomy as likely obstructing Check echocardiogram. Needs flp at f/u Chest pains - if MSK - pred might help, if anxiety, benzo might hep, if gerd - will likely get worse and cons starting ppi at f/u. F/u in 2-3 wks for recheck.  Dyspnea and respiratory abnormality - Plan: POCT CBC, POCT SEDIMENTATION RATE, DG Chest 2 View, EKG 12-Lead, albuterol (PROVENTIL) (2.5 MG/3ML) 0.083% nebulizer solution 5 mg  Severe obstructive sleep apnea  Tonsillar hypertrophy  Morbid obesity - Plan: Comprehensive metabolic panel  Lumbar radiculopathy, chronic  Anxiety state, unspecified - Plan: TSH  Panic attack  Sciatica, unspecified laterality  Sinus  congestion  Meds ordered this encounter  Medications  . albuterol (PROVENTIL) (2.5 MG/3ML) 0.083% nebulizer solution 5 mg    Sig:   . DISCONTD: predniSONE (DELTASONE) 20 MG tablet    Sig: 4 tabs a day x 3d, 3 tabs a day x 3d, 2 tabs a day x 3d, 1 tab a day x 3d    Dispense:  30 tablet    Refill:  0  . DISCONTD: diazepam (VALIUM) 5 MG tablet    Sig: Take 1 tablet (5 mg total) by mouth every 12 (twelve) hours as needed for anxiety or sleep (muscle spasm).    Dispense:  30 tablet    Refill:  0  . DISCONTD: citalopram (CELEXA) 10 MG tablet    Sig: Take 1 tablet (10 mg total) by mouth daily.    Dispense:  30 tablet    Refill:  1

## 2012-08-23 NOTE — Patient Instructions (Addendum)
Sciatica with Rehab The sciatic nerve runs from the back down the leg and is responsible for sensation and control of the muscles in the back (posterior) side of the thigh, lower leg, and foot. Sciatica is a condition that is characterized by inflammation of this nerve.  SYMPTOMS   Signs of nerve damage, including numbness and/or weakness along the posterior side of the lower extremity.  Pain in the back of the thigh that may also travel down the leg.  Pain that worsens when sitting for long periods of time.  Occasionally, pain in the back or buttock. CAUSES  Inflammation of the sciatic nerve is the cause of sciatica. The inflammation is due to something irritating the nerve. Common sources of irritation include:  Sitting for long periods of time.  Direct trauma to the nerve.  Arthritis of the spine.  Herniated or ruptured disk.  Slipping of the vertebrae (spondylolithesis)  Pressure from soft tissues, such as muscles or ligament-like tissue (fascia). RISK INCREASES WITH:  Sports that place pressure or stress on the spine (football or weightlifting).  Poor strength and flexibility.  Failure to warm-up properly before activity.  Family history of low back pain or disk disorders.  Previous back injury or surgery.  Poor body mechanics, especially when lifting, or poor posture. PREVENTION   Warm up and stretch properly before activity.  Maintain physical fitness:  Strength, flexibility, and endurance.  Cardiovascular fitness.  Learn and use proper technique, especially with posture and lifting. When possible, have coach correct improper technique.  Avoid activities that place stress on the spine. PROGNOSIS If treated properly, then sciatica usually resolves within 6 weeks. However, occasionally surgery is necessary.  RELATED COMPLICATIONS   Permanent nerve damage, including pain, numbness, tingle, or weakness.  Chronic back pain.  Risks of surgery: infection,  bleeding, nerve damage, or damage to surrounding tissues. TREATMENT Treatment initially involves resting from any activities that aggravate your symptoms. The use of ice and medication may help reduce pain and inflammation. The use of strengthening and stretching exercises may help reduce pain with activity. These exercises may be performed at home or with referral to a therapist. A therapist may recommend further treatments, such as transcutaneous electronic nerve stimulation (TENS) or ultrasound. Your caregiver may recommend corticosteroid injections to help reduce inflammation of the sciatic nerve. If symptoms persist despite non-surgical (conservative) treatment, then surgery may be recommended. MEDICATION  If pain medication is necessary, then nonsteroidal anti-inflammatory medications, such as aspirin and ibuprofen, or other minor pain relievers, such as acetaminophen, are often recommended.  Do not take pain medication for 7 days before surgery.  Prescription pain relievers may be given if deemed necessary by your caregiver. Use only as directed and only as much as you need.  Ointments applied to the skin may be helpful.  Corticosteroid injections may be given by your caregiver. These injections should be reserved for the most serious cases, because they may only be given a certain number of times. HEAT AND COLD  Cold treatment (icing) relieves pain and reduces inflammation. Cold treatment should be applied for 10 to 15 minutes every 2 to 3 hours for inflammation and pain and immediately after any activity that aggravates your symptoms. Use ice packs or massage the area with a piece of ice (ice massage).  Heat treatment may be used prior to performing the stretching and strengthening activities prescribed by your caregiver, physical therapist, or athletic trainer. Use a heat pack or soak the injury in warm water. SEEK   MEDICAL CARE IF:  Treatment seems to offer no benefit, or the condition  worsens.  Any medications produce adverse side effects. EXERCISES  RANGE OF MOTION (ROM) AND STRETCHING EXERCISES - Sciatica Most people with sciatic will find that their symptoms worsen with either excessive bending forward (flexion) or arching at the low back (extension). The exercises which will help resolve your symptoms will focus on the opposite motion. Your physician, physical therapist or athletic trainer will help you determine which exercises will be most helpful to resolve your low back pain. Do not complete any exercises without first consulting with your clinician. Discontinue any exercises which worsen your symptoms until you speak to your clinician. If you have pain, numbness or tingling which travels down into your buttocks, leg or foot, the goal of the therapy is for these symptoms to move closer to your back and eventually resolve. Occasionally, these leg symptoms will get better, but your low back pain may worsen; this is typically an indication of progress in your rehabilitation. Be certain to be very alert to any changes in your symptoms and the activities in which you participated in the 24 hours prior to the change. Sharing this information with your clinician will allow him/her to most efficiently treat your condition. These exercises may help you when beginning to rehabilitate your injury. Your symptoms may resolve with or without further involvement from your physician, physical therapist or athletic trainer. While completing these exercises, remember:   Restoring tissue flexibility helps normal motion to return to the joints. This allows healthier, less painful movement and activity.  An effective stretch should be held for at least 30 seconds.  A stretch should never be painful. You should only feel a gentle lengthening or release in the stretched tissue. FLEXION RANGE OF MOTION AND STRETCHING EXERCISES: STRETCH  Flexion, Single Knee to Chest   Lie on a firm bed or floor  with both legs extended in front of you.  Keeping one leg in contact with the floor, bring your opposite knee to your chest. Hold your leg in place by either grabbing behind your thigh or at your knee.  Pull until you feel a gentle stretch in your low back. Hold __________ seconds.  Slowly release your grasp and repeat the exercise with the opposite side. Repeat __________ times. Complete this exercise __________ times per day.  STRETCH  Flexion, Double Knee to Chest  Lie on a firm bed or floor with both legs extended in front of you.  Keeping one leg in contact with the floor, bring your opposite knee to your chest.  Tense your stomach muscles to support your back and then lift your other knee to your chest. Hold your legs in place by either grabbing behind your thighs or at your knees.  Pull both knees toward your chest until you feel a gentle stretch in your low back. Hold __________ seconds.  Tense your stomach muscles and slowly return one leg at a time to the floor. Repeat __________ times. Complete this exercise __________ times per day.  STRETCH  Low Trunk Rotation   Lie on a firm bed or floor. Keeping your legs in front of you, bend your knees so they are both pointed toward the ceiling and your feet are flat on the floor.  Extend your arms out to the side. This will stabilize your upper body by keeping your shoulders in contact with the floor.  Gently and slowly drop both knees together to one side until you   feel a gentle stretch in your low back. Hold for __________ seconds.  Tense your stomach muscles to support your low back as you bring your knees back to the starting position. Repeat the exercise to the other side. Repeat __________ times. Complete this exercise __________ times per day  EXTENSION RANGE OF MOTION AND FLEXIBILITY EXERCISES: STRETCH  Extension, Prone on Elbows  Lie on your stomach on the floor, a bed will be too soft. Place your palms about shoulder  width apart and at the height of your head.  Place your elbows under your shoulders. If this is too painful, stack pillows under your chest.  Allow your body to relax so that your hips drop lower and make contact more completely with the floor.  Hold this position for __________ seconds.  Slowly return to lying flat on the floor. Repeat __________ times. Complete this exercise __________ times per day.  RANGE OF MOTION  Extension, Prone Press Ups  Lie on your stomach on the floor, a bed will be too soft. Place your palms about shoulder width apart and at the height of your head.  Keeping your back as relaxed as possible, slowly straighten your elbows while keeping your hips on the floor. You may adjust the placement of your hands to maximize your comfort. As you gain motion, your hands will come more underneath your shoulders.  Hold this position __________ seconds.  Slowly return to lying flat on the floor. Repeat __________ times. Complete this exercise __________ times per day.  STRENGTHENING EXERCISES - Sciatica  These exercises may help you when beginning to rehabilitate your injury. These exercises should be done near your "sweet spot." This is the neutral, low-back arch, somewhere between fully rounded and fully arched, that is your least painful position. When performed in this safe range of motion, these exercises can be used for people who have either a flexion or extension based injury. These exercises may resolve your symptoms with or without further involvement from your physician, physical therapist or athletic trainer. While completing these exercises, remember:   Muscles can gain both the endurance and the strength needed for everyday activities through controlled exercises.  Complete these exercises as instructed by your physician, physical therapist or athletic trainer. Progress with the resistance and repetition exercises only as your caregiver advises.  You may  experience muscle soreness or fatigue, but the pain or discomfort you are trying to eliminate should never worsen during these exercises. If this pain does worsen, stop and make certain you are following the directions exactly. If the pain is still present after adjustments, discontinue the exercise until you can discuss the trouble with your clinician. STRENGTHENING Deep Abdominals, Pelvic Tilt   Lie on a firm bed or floor. Keeping your legs in front of you, bend your knees so they are both pointed toward the ceiling and your feet are flat on the floor.  Tense your lower abdominal muscles to press your low back into the floor. This motion will rotate your pelvis so that your tail bone is scooping upwards rather than pointing at your feet or into the floor.  With a gentle tension and even breathing, hold this position for __________ seconds. Repeat __________ times. Complete this exercise __________ times per day.  STRENGTHENING  Abdominals, Crunches   Lie on a firm bed or floor. Keeping your legs in front of you, bend your knees so they are both pointed toward the ceiling and your feet are flat on the floor. Cross   your arms over your chest.  Slightly tip your chin down without bending your neck.  Tense your abdominals and slowly lift your trunk high enough to just clear your shoulder blades. Lifting higher can put excessive stress on the low back and does not further strengthen your abdominal muscles.  Control your return to the starting position. Repeat __________ times. Complete this exercise __________ times per day.  STRENGTHENING  Quadruped, Opposite UE/LE Lift  Assume a hands and knees position on a firm surface. Keep your hands under your shoulders and your knees under your hips. You may place padding under your knees for comfort.  Find your neutral spine and gently tense your abdominal muscles so that you can maintain this position. Your shoulders and hips should form a rectangle that  is parallel with the floor and is not twisted.  Keeping your trunk steady, lift your right hand no higher than your shoulder and then your left leg no higher than your hip. Make sure you are not holding your breath. Hold this position __________ seconds.  Continuing to keep your abdominal muscles tense and your back steady, slowly return to your starting position. Repeat with the opposite arm and leg. Repeat __________ times. Complete this exercise __________ times per day.  STRENGTHENING  Abdominals and Quadriceps, Straight Leg Raise   Lie on a firm bed or floor with both legs extended in front of you.  Keeping one leg in contact with the floor, bend the other knee so that your foot can rest flat on the floor.  Find your neutral spine, and tense your abdominal muscles to maintain your spinal position throughout the exercise.  Slowly lift your straight leg off the floor about 6 inches for a count of 15, making sure to not hold your breath.  Still keeping your neutral spine, slowly lower your leg all the way to the floor. Repeat this exercise with each leg __________ times. Complete this exercise __________ times per day. POSTURE AND BODY MECHANICS CONSIDERATIONS - Sciatica Keeping correct posture when sitting, standing or completing your activities will reduce the stress put on different body tissues, allowing injured tissues a chance to heal and limiting painful experiences. The following are general guidelines for improved posture. Your physician or physical therapist will provide you with any instructions specific to your needs. While reading these guidelines, remember:  The exercises prescribed by your provider will help you have the flexibility and strength to maintain correct postures.  The correct posture provides the optimal environment for your joints to work. All of your joints have less wear and tear when properly supported by a spine with good posture. This means you will  experience a healthier, less painful body.  Correct posture must be practiced with all of your activities, especially prolonged sitting and standing. Correct posture is as important when doing repetitive low-stress activities (typing) as it is when doing a single heavy-load activity (lifting). RESTING POSITIONS Consider which positions are most painful for you when choosing a resting position. If you have pain with flexion-based activities (sitting, bending, stooping, squatting), choose a position that allows you to rest in a less flexed posture. You would want to avoid curling into a fetal position on your side. If your pain worsens with extension-based activities (prolonged standing, working overhead), avoid resting in an extended position such as sleeping on your stomach. Most people will find more comfort when they rest with their spine in a more neutral position, neither too rounded nor too arched. Lying   on a non-sagging bed on your side with a pillow between your knees, or on your back with a pillow under your knees will often provide some relief. Keep in mind, being in any one position for a prolonged period of time, no matter how correct your posture, can still lead to stiffness. PROPER SITTING POSTURE In order to minimize stress and discomfort on your spine, you must sit with correct posture Sitting with good posture should be effortless for a healthy body. Returning to good posture is a gradual process. Many people can work toward this most comfortably by using various supports until they have the flexibility and strength to maintain this posture on their own. When sitting with proper posture, your ears will fall over your shoulders and your shoulders will fall over your hips. You should use the back of the chair to support your upper back. Your low back will be in a neutral position, just slightly arched. You may place a small pillow or folded towel at the base of your low back for support.  When  working at a desk, create an environment that supports good, upright posture. Without extra support, muscles fatigue and lead to excessive strain on joints and other tissues. Keep these recommendations in mind: CHAIR:   A chair should be able to slide under your desk when your back makes contact with the back of the chair. This allows you to work closely.  The chair's height should allow your eyes to be level with the upper part of your monitor and your hands to be slightly lower than your elbows. BODY POSITION  Your feet should make contact with the floor. If this is not possible, use a foot rest.  Keep your ears over your shoulders. This will reduce stress on your neck and low back. INCORRECT SITTING POSTURES   If you are feeling tired and unable to assume a healthy sitting posture, do not slouch or slump. This puts excessive strain on your back tissues, causing more damage and pain. Healthier options include:  Using more support, like a lumbar pillow.  Switching tasks to something that requires you to be upright or walking.  Talking a brief walk.  Lying down to rest in a neutral-spine position. PROLONGED STANDING WHILE SLIGHTLY LEANING FORWARD  When completing a task that requires you to lean forward while standing in one place for a long time, place either foot up on a stationary 2-4 inch high object to help maintain the best posture. When both feet are on the ground, the low back tends to lose its slight inward curve. If this curve flattens (or becomes too large), then the back and your other joints will experience too much stress, fatigue more quickly and can cause pain.  CORRECT STANDING POSTURES Proper standing posture should be assumed with all daily activities, even if they only take a few moments, like when brushing your teeth. As in sitting, your ears should fall over your shoulders and your shoulders should fall over your hips. You should keep a slight tension in your abdominal  muscles to brace your spine. Your tailbone should point down to the ground, not behind your body, resulting in an over-extended swayback posture.  INCORRECT STANDING POSTURES  Common incorrect standing postures include a forward head, locked knees and/or an excessive swayback. WALKING Walk with an upright posture. Your ears, shoulders and hips should all line-up. PROLONGED ACTIVITY IN A FLEXED POSITION When completing a task that requires you to bend forward at your   waist or lean over a low surface, try to find a way to stabilize 3 of 4 of your limbs. You can place a hand or elbow on your thigh or rest a knee on the surface you are reaching across. This will provide you more stability so that your muscles do not fatigue as quickly. By keeping your knees relaxed, or slightly bent, you will also reduce stress across your low back. CORRECT LIFTING TECHNIQUES DO :   Assume a wide stance. This will provide you more stability and the opportunity to get as close as possible to the object which you are lifting.  Tense your abdominals to brace your spine; then bend at the knees and hips. Keeping your back locked in a neutral-spine position, lift using your leg muscles. Lift with your legs, keeping your back straight.  Test the weight of unknown objects before attempting to lift them.  Try to keep your elbows locked down at your sides in order get the best strength from your shoulders when carrying an object.  Always ask for help when lifting heavy or awkward objects. INCORRECT LIFTING TECHNIQUES DO NOT:   Lock your knees when lifting, even if it is a small object.  Bend and twist. Pivot at your feet or move your feet when needing to change directions.  Assume that you cannot safely pick up a paperclip without proper posture. Document Released: 04/20/2005 Document Revised: 07/13/2011 Document Reviewed: 08/02/2008 [facility] Patient Information 2013 [place], [place]. Anxiety and Panic Attacks Your  caregiver has informed you that you are having an anxiety or panic attack. There may be many forms of this. Most of the time these attacks come suddenly and without warning. They come at any time of day, including periods of sleep, and at any time of life. They may be strong and unexplained. Although panic attacks are very scary, they are physically harmless. Sometimes the cause of your anxiety is not known. Anxiety is a protective mechanism of the body in its fight or flight mechanism. Most of these perceived danger situations are actually nonphysical situations (such as anxiety over losing a job). CAUSES  The causes of an anxiety or panic attack are many. Panic attacks may occur in otherwise healthy people given a certain set of circumstances. There may be a genetic cause for panic attacks. Some medications may also have anxiety as a side effect. SYMPTOMS  Some of the most common feelings are:  Intense terror.  Dizziness, feeling faint.  Hot and cold flashes.  Fear of going crazy.  Feelings that nothing is real.  Sweating.  Shaking.  Chest pain or a fast heartbeat (palpitations).  Smothering, choking sensations.  Feelings of impending doom and that death is near.  Tingling of extremities, this may be from over-breathing.  Altered reality (derealization).  Being detached from yourself (depersonalization). Several symptoms can be present to make up anxiety or panic attacks. DIAGNOSIS  The evaluation by your caregiver will depend on the type of symptoms you are experiencing. The diagnosis of anxiety or panic attack is made when no physical illness can be determined to be a cause of the symptoms. TREATMENT  Treatment to prevent anxiety and panic attacks may include:  Avoidance of circumstances that cause anxiety.  Reassurance and relaxation.  Regular exercise.  Relaxation therapies, such as yoga.  Psychotherapy with a psychiatrist or therapist.  Avoidance of caffeine,  alcohol and illegal drugs.  Prescribed medication. SEEK IMMEDIATE MEDICAL CARE IF:   You experience panic attack symptoms that  are different than your usual symptoms.  You have any worsening or concerning symptoms. Document Released: 04/20/2005 Document Revised: 07/13/2011 Document Reviewed: 08/22/2009 [facility] Patient Information 2013 [place], [place].

## 2012-08-31 ENCOUNTER — Other Ambulatory Visit: Payer: Self-pay | Admitting: Family Medicine

## 2012-08-31 DIAGNOSIS — G4733 Obstructive sleep apnea (adult) (pediatric): Secondary | ICD-10-CM | POA: Insufficient documentation

## 2012-08-31 NOTE — Progress Notes (Signed)
Discussed diagnosis of severe OSA w/ Dennison Mascot, Manager at [facility].  The NPSG was ordered by ENT but I would like the order for his cpap titration study to be put under my name so we will be the point of contact to arrange the pt's DME and any further testing since I am pt's PCP and pt will likely not need to see ENT again.  Order placed.

## 2012-09-01 ENCOUNTER — Encounter (HOSPITAL_BASED_OUTPATIENT_CLINIC_OR_DEPARTMENT_OTHER): Payer: BC Managed Care – PPO

## 2012-09-14 ENCOUNTER — Ambulatory Visit (HOSPITAL_BASED_OUTPATIENT_CLINIC_OR_DEPARTMENT_OTHER): Payer: BC Managed Care – PPO | Attending: Physician Assistant | Admitting: Radiology

## 2012-09-14 VITALS — Ht 70.0 in | Wt 285.0 lb

## 2012-09-14 DIAGNOSIS — G4733 Obstructive sleep apnea (adult) (pediatric): Secondary | ICD-10-CM

## 2012-09-14 DIAGNOSIS — G4730 Sleep apnea, unspecified: Secondary | ICD-10-CM | POA: Insufficient documentation

## 2012-09-14 DIAGNOSIS — G4710 Hypersomnia, unspecified: Secondary | ICD-10-CM | POA: Insufficient documentation

## 2012-09-17 DIAGNOSIS — G4710 Hypersomnia, unspecified: Secondary | ICD-10-CM

## 2012-09-17 DIAGNOSIS — G4730 Sleep apnea, unspecified: Secondary | ICD-10-CM

## 2012-09-17 NOTE — Procedures (Signed)
NAMEVENCENT, HAUSCHILD NO.:  [id number]  MEDICAL RECORD NO.:  [id number]          PATIENT TYPE:  OUT  LOCATION:  SLEEP CENTER                 FACILITY:  [facility]  PHYSICIAN:  Ronnell Clinger D. Maple Hudson, MD, FCCP, FACPDATE OF BIRTH:  March 15, 1985  DATE OF STUDY:  09/14/2012                           NOCTURNAL POLYSOMNOGRAM  REFERRING PHYSICIAN:  LOUISE NORDBLADH  INDICATION FOR STUDY:  Hypersomnia with sleep apnea.  EPWORTH SLEEPINESS SCORE:  13/24.  BMI 40.9, weight 285 pounds.  Height 70 inches, neck 19 inches.  MEDICATIONS:  Home medications charted and reviewed.  A baseline diagnostic NPSG on August 19, 2012 recorded AHI 130.4 per hour with very severe obstructive sleep apnea.  Body weight was 285 pounds. CPAP titration is now requested.  SLEEP ARCHITECTURE:  Total sleep time 378 minutes with sleep efficiency 95.6%.  Stage I was 5.3%, stage II 70.5%.  Stage III 0.3%, REM 23.9% of total sleep time.  Sleep latency 5 minutes, REM latency 157 minutes. Awake after sleep onset 11 minutes.  Arousal index 10.6.  Bedtime medication:  None.  RESPIRATORY DATA:  CPAP titration protocol.  CPAP was titrated to 19 CWP, AHI 0 per hour.  He wore a medium ResMed Quattro FX full-face mask with heated humidifier.  OXYGEN DATA:  Snoring was prevented by CPAP and mean oxygen saturation of 96.2% on room air.  CARDIAC DATA:  Normal sinus rhythm.  MOVEMENT/PARASOMNIA:  No significant movement disturbance.  No bathroom trips.  IMPRESSION/RECOMMENDATIONS: 1. Successful CPAP titration to 19 CWP, AHI 0 per hour.  He wore a     medium ResMed Quattro FX full-face mask with heated humidifier.     Snoring was prevented and mean oxygen saturation held 96.2% on room     air with CPAP. 2. Baseline diagnostic NPSG on August 19, 2012 had recorded AHI 130.4     per hour.  Body weight for that study was 285 pounds.     Casia Corti D. Maple Hudson, MD, [facility], FACP Diplomate, American Board of Sleep  Medicine    CDY/MEDQ  D:  09/17/2012 10:35:34  T:  09/17/2012 14:02:57  Job:  [id number]

## 2012-09-28 ENCOUNTER — Encounter: Payer: Self-pay | Admitting: Family Medicine

## 2012-09-30 ENCOUNTER — Ambulatory Visit (INDEPENDENT_AMBULATORY_CARE_PROVIDER_SITE_OTHER): Payer: BC Managed Care – PPO | Admitting: Family Medicine

## 2012-09-30 ENCOUNTER — Encounter: Payer: Self-pay | Admitting: Family Medicine

## 2012-09-30 ENCOUNTER — Ambulatory Visit: Payer: BC Managed Care – PPO

## 2012-09-30 VITALS — BP 128/88 | HR 108 | Temp 98.1°F | Resp 18 | Ht 69.0 in | Wt 289.0 lb

## 2012-09-30 DIAGNOSIS — M5416 Radiculopathy, lumbar region: Secondary | ICD-10-CM

## 2012-09-30 DIAGNOSIS — M545 Low back pain, unspecified: Secondary | ICD-10-CM

## 2012-09-30 DIAGNOSIS — F411 Generalized anxiety disorder: Secondary | ICD-10-CM

## 2012-09-30 DIAGNOSIS — G4733 Obstructive sleep apnea (adult) (pediatric): Secondary | ICD-10-CM

## 2012-09-30 MED ORDER — TRAMADOL HCL 50 MG PO TABS: 50.0000 mg | ORAL_TABLET | Freq: Three times a day (TID) | ORAL | Status: DC | PRN

## 2012-09-30 MED ORDER — CLONAZEPAM 1 MG PO TABS
1.0000 mg | ORAL_TABLET | Freq: Two times a day (BID) | ORAL | Status: DC | PRN
Start: 2012-09-30 — End: 2012-11-11

## 2012-09-30 MED ORDER — CITALOPRAM HYDROBROMIDE 10 MG PO TABS: 20.0000 mg | ORAL_TABLET | Freq: Every day | ORAL | Status: DC

## 2012-09-30 MED ORDER — DICLOFENAC SODIUM 75 MG PO TBEC: 75.0000 mg | DELAYED_RELEASE_TABLET | Freq: Two times a day (BID) | ORAL | Status: DC

## 2012-09-30 NOTE — Progress Notes (Addendum)
Subjective:    Patient ID: Eric Hardy, male    DOB: [birth year], 27 y.o.   MRN: [id number] Chief Complaint  Patient presents with  . Follow-up    BACK AND PREDISONE  . Medication Refill   HPI At Eric Hardy's last visit, we started a trial of prednisone to see if it would help his shortness of breath and his chronic low back. Could definitely tell that the prednisone improved on the back and leg pain - feels like he has vibration in legs - his right leg has tingled constantly for years and does notice that it is definitely weaker than his left.  Works in [employer] - does not sit - walk a ll day.  Pain worse when driving - sitting still hurts a lot.   Has been taking otc ibuprofen regularly - often sev times a day when working - w/o relief. Tylenol does not help either.  Since our last OV, pt had his repeat sleep study for cpap titration and finally got his machine from [facility] yesterday. He then fell asleep on the couch so only ended up using it for about 3 hrs last night.  I discussed Eric Hardy very large tonsils with the PA and ENT who he saw who did not think tonsillectomy would make a substantial difference in his OSA and pharyngeal congestion sxs unless he failed CPAP.   NOT fasting today - forgot.  The prednisone did not seem to make any difference in his chest pain. We also did a trial of prn inhaler. Therefore, chest pains less likely to be MSK. Could still do trial of ppi to ensure GERD not exacerbating but pt feels very sure and convinced that his chest pain is due to anxiety.  Has been taking the citalopram 10mg  - not noticing any difference yet.  He thinks the valium helped him sleep too.  No past medical history on file. No current outpatient prescriptions on file prior to visit.   No current facility-administered medications on file prior to visit.   No Known Allergies   Review of Systems  Constitutional: Positive for fatigue. Negative for fever, chills,  diaphoresis, activity change, appetite change and unexpected weight change.  Respiratory: Positive for apnea, chest tightness, shortness of breath and wheezing.   Cardiovascular: Positive for chest pain. Negative for palpitations.  Gastrointestinal: Negative for nausea, vomiting, abdominal pain, diarrhea and constipation.  Genitourinary: Negative for urgency, frequency, decreased urine volume and difficulty urinating.  Musculoskeletal: Positive for myalgias, back pain and arthralgias. Negative for gait problem.  Skin: Negative for color change and wound.  Neurological: Positive for weakness and numbness. Negative for dizziness, tremors, syncope and light-headedness.  Psychiatric/Behavioral: Positive for sleep disturbance and decreased concentration. Negative for suicidal ideas, hallucinations, confusion, self-injury, dysphoric mood and agitation. The patient is nervous/anxious. The patient is not hyperactive.       BP 128/88  Pulse 108  Temp(Src) 98.1 F (36.7 C) (Oral)  Resp 18  Ht 5\' 9"  (1.753 m)  Wt 289 lb (131.09 kg)  BMI 42.66 kg/m2  SpO2 97% Objective:   Physical Exam  Constitutional: He is oriented to person, place, and time. He appears well-developed and well-nourished. No distress.  HENT:  Head: Normocephalic and atraumatic.  Eyes: Conjunctivae are normal. Pupils are equal, round, and reactive to light. No scleral icterus.  Neck: Normal range of motion. Neck supple. No thyromegaly present.  Cardiovascular: Normal rate, regular rhythm, normal heart sounds and intact distal pulses.   Pulmonary/Chest: Effort  normal and breath sounds normal. No respiratory distress.  Musculoskeletal: Normal range of motion. He exhibits tenderness. He exhibits no edema.       Thoracic back: He exhibits tenderness and spasm. He exhibits normal range of motion, no bony tenderness, no swelling and no deformity.       Lumbar back: He exhibits tenderness and spasm. He exhibits normal range of motion, no  bony tenderness, no edema and no deformity.  Lymphadenopathy:    He has no cervical adenopathy.  Neurological: He is alert and oriented to person, place, and time. No sensory deficit. Coordination and gait normal.  Reflex Scores:      Patellar reflexes are 1+ on the right side and 2+ on the left side.      Achilles reflexes are 1+ on the right side and 2+ on the left side. strenth 4+/5 on right lower ext, 5/5 on left  Skin: Skin is warm and dry. No rash noted. He is not diaphoretic. No erythema.  Psychiatric: He has a normal mood and affect. His behavior is normal.     [facility] reading (PRIMARY) by  Dr. Clelia Croft. L-spine - posterior disc-space narrowing at L5-S1 Assessment & Plan:  Lumbar radiculopathy, chronic - Plan: DG Lumbar Spine 2-3 Views, MR Lumbar Spine Wo Contrast - failed past 6 wks of conservative therapy with nsaids, prednisone, stretching, heat. Having worsening neurologic changes of weakness in his right leg so will proceed w/ MRI. In the meantime, do trial of diclofenac w/ prn tramadol.  Severe obstructive sleep apnea - Start cpap today - may have trouble getting use to it - ok to try prn klonopin qhs.  Anxiety state, unspecified - increase citalpram from 10 to 20 and start trial of prn clonazepam.  Obesity - need to look for echo results. Needs flp at f/u  Meds ordered this encounter  Medications  . DISCONTD: diazepam (VALIUM) 5 MG tablet    Sig: Take 5 mg by mouth every 12 (twelve) hours as needed for anxiety or sleep (muscle spasm). NEED REFILLS  . DISCONTD: citalopram (CELEXA) 10 MG tablet    Sig: Take 10 mg by mouth daily. NEED REFILLS  . citalopram (CELEXA) 10 MG tablet    Sig: Take 2 tablets (20 mg total) by mouth daily.    Dispense:  30 tablet    Refill:  1  . clonazePAM (KLONOPIN) 1 MG tablet    Sig: Take 1 tablet (1 mg total) by mouth 2 (two) times daily as needed for anxiety.    Dispense:  60 tablet    Refill:  1  . diclofenac (VOLTAREN) 75 MG EC tablet    Sig:  Take 1 tablet (75 mg total) by mouth 2 (two) times daily.    Dispense:  60 tablet    Refill:  1  . traMADol (ULTRAM) 50 MG tablet    Sig: Take 1 tablet (50 mg total) by mouth every 8 (eight) hours as needed for pain.    Dispense:  20 tablet    Refill:  0

## 2012-10-03 ENCOUNTER — Telehealth: Payer: Self-pay

## 2012-10-03 MED ORDER — CITALOPRAM HYDROBROMIDE 20 MG PO TABS: 20.0000 mg | ORAL_TABLET | Freq: Every day | ORAL | Status: DC

## 2012-10-03 NOTE — Telephone Encounter (Signed)
PT STATES HE WAS GIVEN TRAMADOL AND IT ISN'T WORKING. NEED TO HAVE SOMETHING ELSE STRONGER PLEASE CALL [phone]   [facility] ON [address]

## 2012-10-03 NOTE — Telephone Encounter (Signed)
Patient calling about getting a different pain med for his back. [phone]

## 2012-10-04 ENCOUNTER — Telehealth: Payer: Self-pay | Admitting: Radiology

## 2012-10-04 NOTE — Telephone Encounter (Signed)
In order for the MRI scan to be covered patient has to have tried and failed medications (NSAID) over course of 4-6 weeks. OR physical therapy for 4-6 weeks. You started patient on diclofenac. We will have to see him back in 4-6 weeks to re evaluate after he has been on the NSAIDS, then if there is not improvement we can reorder the scan.

## 2012-10-05 NOTE — Telephone Encounter (Signed)
He was on otc ibuprofen for 6 wks. Doesn't this count?

## 2012-10-06 ENCOUNTER — Telehealth: Payer: Self-pay

## 2012-10-06 NOTE — Telephone Encounter (Signed)
Perhaps. I will try.

## 2012-10-06 NOTE — Telephone Encounter (Signed)
Called [facility] to give additional information// this was denied to you FYI You must see him back and document failure to improve in the office note, unless he has a neurologic deficit.

## 2012-10-06 NOTE — Telephone Encounter (Signed)
Pt would like a different prescription called in for his back pain. [id number].

## 2012-10-07 ENCOUNTER — Other Ambulatory Visit: Payer: Self-pay | Admitting: Family Medicine

## 2012-10-07 MED ORDER — CAPSICUM OLEORESIN 0.075 % EX CREA: 1.0000 "application " | TOPICAL_CREAM | Freq: Two times a day (BID) | CUTANEOUS | Status: DC

## 2012-10-07 MED ORDER — GABAPENTIN 300 MG PO CAPS: 300.0000 mg | ORAL_CAPSULE | Freq: Three times a day (TID) | ORAL | Status: DC

## 2012-10-07 NOTE — Telephone Encounter (Signed)
There was an original message put in on 10/03/2012 and sent to Dr Clelia Croft. The patient called back yesterday 10/06/2012, wanting to know if a different medication could be sent in, because the tramadol does not seem to be helping. Please advise. Thanks!

## 2012-10-07 NOTE — Telephone Encounter (Signed)
This is CHRONIC back pain - until we have additional info from MRI or physical therapy, I am reluctant to start narcotics, especially over the phone, as it would likely turn into chronic narcotic dependency considering the nature of his back pain - at least until we have further information from imaging and/or specialists, or a plan for his future treatment.  If pt feels that he does need chronic narcotic therapy and doesn't want to wait for this info, we can refer pt to a pain medicine clinic. Otherwise - he can try 2 tramadol at a time in addition to tylenol, continue diclofenac 75mg  tid, and we can start a trial of topical capsaicin cream and some neurontin (which should help the nerve pain/vibrations.)  Start the neurotin qhs x 2 wks then work up to bid.

## 2012-10-07 NOTE — Telephone Encounter (Signed)
My last OV note - which was approx 6 wks after initial OV for his radicular back pain - stated in the assessment "failed past 6 wks of conservative therapy with nsaids, prednisone, stretching, heat. Having worsening neurologic changes of weakness in his right leg so will proceed w/ MRI".  Initial OV for eval of this was 4/22 and ordered MRI after failure of otc ibuprofen, prednisone, flexeril, and pt w/ worsening Rt leg weakness -( a neurologic deficit) on 5/30 - 4 days shy of 6 wks. . . .

## 2012-10-08 NOTE — Telephone Encounter (Signed)
When I called to give this information, I was told it was denied, we can appeal, when you are in the office this week we will call Dazhane Villagomez

## 2012-10-11 NOTE — Telephone Encounter (Signed)
I have spoken to patient regarding his need for MRI, unfortunately his insurance has declined the scan patient will need to return to clinic after 4 weeks on the diclofenac and gabapentin, and it must be documented he has had no improvement, then the scan will be covered. He will try the tramadol 2 at a time and try the gabapentin.

## 2012-11-09 ENCOUNTER — Telehealth: Payer: Self-pay

## 2012-11-09 NOTE — Telephone Encounter (Signed)
I did not call him, he does have appt on 7/11 perhaps it was a reminder call about the appt. Called him to advise.

## 2012-11-09 NOTE — Telephone Encounter (Signed)
Patient had two missed calls.  Awaiting a call from [place] to see why we are calling him.   [phone]

## 2012-11-11 ENCOUNTER — Encounter: Payer: Self-pay | Admitting: Family Medicine

## 2012-11-11 ENCOUNTER — Ambulatory Visit (INDEPENDENT_AMBULATORY_CARE_PROVIDER_SITE_OTHER): Payer: BC Managed Care – PPO | Admitting: Family Medicine

## 2012-11-11 VITALS — BP 138/90 | HR 97 | Temp 98.3°F | Resp 18 | Ht 69.0 in | Wt 290.4 lb

## 2012-11-11 DIAGNOSIS — K047 Periapical abscess without sinus: Secondary | ICD-10-CM

## 2012-11-11 DIAGNOSIS — M5416 Radiculopathy, lumbar region: Secondary | ICD-10-CM

## 2012-11-11 DIAGNOSIS — IMO0001 Reserved for inherently not codable concepts without codable children: Secondary | ICD-10-CM

## 2012-11-11 DIAGNOSIS — F411 Generalized anxiety disorder: Secondary | ICD-10-CM

## 2012-11-11 MED ORDER — CITALOPRAM HYDROBROMIDE 20 MG PO TABS: 20.0000 mg | ORAL_TABLET | Freq: Every day | ORAL | Status: DC

## 2012-11-11 MED ORDER — LIDOCAINE 5 % EX PTCH: 1.0000 | MEDICATED_PATCH | CUTANEOUS | Status: DC

## 2012-11-11 MED ORDER — GABAPENTIN 300 MG PO CAPS: 300.0000 mg | ORAL_CAPSULE | Freq: Three times a day (TID) | ORAL | Status: DC

## 2012-11-11 MED ORDER — CLONAZEPAM 1 MG PO TABS: 1.0000 mg | ORAL_TABLET | Freq: Two times a day (BID) | ORAL | Status: DC | PRN

## 2012-11-11 MED ORDER — HYDROCODONE-ACETAMINOPHEN 5-325 MG PO TABS: 2.0000 | ORAL_TABLET | Freq: Every day | ORAL | Status: DC | PRN

## 2012-11-11 MED ORDER — PENICILLIN V POTASSIUM 500 MG PO TABS: 500.0000 mg | ORAL_TABLET | Freq: Four times a day (QID) | ORAL | Status: DC

## 2012-11-11 NOTE — Progress Notes (Signed)
Subjective:    Patient ID: Eric Hardy, male    DOB: 05-03-1986, 27 y.o.   MRN: [id number] Chief Complaint  Patient presents with  . Follow-up  . Back Pain  . Leg Pain    RIGHT   HPI   Taking the tramadol provides minimal relief - like taking a tylenol, still no difference when he went up two at once on the tramadol.  Also did not notice any difference on the voltaren tid which he has been taking for the past 6 wks.  Rt leg still vibrating and tingling, weakness in Rt seems to be getting worse.  Works in [employer] - does not sit at all - walks all day. Pain worse when driving - sitting still hurts a lot.  Gabapentin seemed to help a little and is now taking it twice a day up to 3 times a day at times.   Anxiety seems to be getting better, sleeping better at night.  Chest pains mainly relieved.  Wearing CPAP.  Is fasting!  Left lower gum very sore and swollen, not draining anything. Has not made an appt w/ a dentist as they won't do anything for him as long as the infection is there.  Review of Systems  Constitutional: Negative for fever and chills.  HENT: Positive for mouth sores and dental problem. Negative for neck pain and neck stiffness.   Respiratory: Positive for apnea, shortness of breath and wheezing. Negative for chest tightness.   Cardiovascular: Negative for chest pain and palpitations.  Gastrointestinal: Negative for abdominal pain, diarrhea and constipation.  Genitourinary: Negative for urgency, frequency, decreased urine volume and difficulty urinating.  Musculoskeletal: Positive for myalgias, back pain, arthralgias and gait problem.  Skin: Negative for color change and wound.  Neurological: Positive for weakness and numbness. Negative for dizziness and light-headedness.  Psychiatric/Behavioral: Positive for sleep disturbance. The patient is nervous/anxious.       BP 138/90  Pulse 97  Temp(Src) 98.3 F (36.8 C) (Oral)  Resp 18  Ht 5\' 9"  (1.753 m)  Wt  290 lb 6.4 oz (131.725 kg)  BMI 42.87 kg/m2  SpO2 98% Objective:   Physical Exam  Constitutional: He is oriented to person, place, and time. He appears well-developed and well-nourished. No distress.  HENT:  Head: Normocephalic and atraumatic.  Eyes: Conjunctivae are normal. Pupils are equal, round, and reactive to light. No scleral icterus.  Neck: Normal range of motion. Neck supple. No thyromegaly present.  Cardiovascular: Normal rate, regular rhythm, normal heart sounds and intact distal pulses.   Pulmonary/Chest: Effort normal and breath sounds normal. No respiratory distress.  Musculoskeletal: He exhibits tenderness. He exhibits no edema.       Right hip: He exhibits decreased strength.       Thoracic back: He exhibits decreased range of motion and spasm. He exhibits no tenderness, no bony tenderness, no swelling and no deformity.       Lumbar back: He exhibits decreased range of motion, tenderness and spasm. He exhibits no bony tenderness, no edema and no deformity.  Lymphadenopathy:    He has no cervical adenopathy.  Neurological: He is alert and oriented to person, place, and time. He displays atrophy. No sensory deficit. He exhibits normal muscle tone. Coordination and gait normal.  Reflex Scores:      Patellar reflexes are 1+ on the right side and 2+ on the left side.      Achilles reflexes are 0 on the right side and 0 on the left  side. Right leg 4+/5, left leg 5/5 strength  Skin: Skin is warm and dry. No rash noted. He is not diaphoretic. No erythema.  Psychiatric: He has a normal mood and affect. His behavior is normal.      Assessment & Plan:  Lumbar radiculopathy, chronic - Plan: MR Lumbar Spine Wo Contrast - no improvement w/ tramadol or diclofenac. Check MRI. Needs PT but difficult w/ work sched.  Trial of lidoderm patch though concerned it won't stay on at work - sweats a lot, has to crawl, climb, etc.  Consider topical lotions if doesn't stay on but helps some. OK to  start few prn norco but not on a daily basis - save it for when pain is severe or had a particularly hard day at work - no more than 2-3x/wk to avoid tolerance and dependence. Pt agreeable w/ plan.  Elevated blood pressure - Plan: Lipid panel - worse today but hoping we would see some improvement in this after CPAP started.  Low salt diet and recheck at f/u.  Consider propranolol?  Morbid obesity - Plan: Lipid panel  Meds ordered this encounter  Medications  . lidocaine (LIDODERM) 5 %    Sig: Place 1 patch onto the skin daily. Remove & Discard patch within 12 hours or as directed by MD    Dispense:  30 patch    Refill:  0  . DISCONTD: gabapentin (NEURONTIN) 300 MG capsule    Sig: Take 1 capsule (300 mg total) by mouth 3 (three) times daily.    Dispense:  90 capsule    Refill:  0  . DISCONTD: citalopram (CELEXA) 20 MG tablet    Sig: Take 1 tablet (20 mg total) by mouth daily.    Dispense:  30 tablet    Refill:  1  . clonazePAM (KLONOPIN) 1 MG tablet    Sig: Take 1 tablet (1 mg total) by mouth 2 (two) times daily as needed for anxiety.    Dispense:  60 tablet    Refill:  1  . HYDROcodone-acetaminophen (NORCO/VICODIN) 5-325 MG per tablet    Sig: Take 2 tablets by mouth daily as needed (severe pain).    Dispense:  30 tablet    Refill:  2  . citalopram (CELEXA) 20 MG tablet    Sig: Take 1 tablet (20 mg total) by mouth daily.    Dispense:  90 tablet    Refill:  1  . penicillin v potassium (VEETID) 500 MG tablet    Sig: Take 1 tablet (500 mg total) by mouth 4 (four) times daily.    Dispense:  56 tablet    Refill:  1  . gabapentin (NEURONTIN) 300 MG capsule    Sig: Take 1 capsule (300 mg total) by mouth 3 (three) times daily.    Dispense:  90 capsule    Refill:  2

## 2012-11-11 NOTE — Patient Instructions (Signed)
[facility] Policy for Prescribing Controlled Substances (Revised 03/2012) 1. Prescriptions for controlled substances will be filled by ONE provider at [facility] with whom you have established and developed a plan for your care, including follow-up. 2. You are encouraged to schedule an appointment with your prescriber at our appointment center for follow-up visits whenever possible. 3. If you request a prescription for the controlled substance while at [facility] for an acute problem (with someone other than your regular prescriber), you MAY be given a ONE-TIME prescription for a 30-day supply of the controlled substance, to allow time for you to return to see your regular prescriber for additional prescriptions.  DASH Diet The DASH diet stands for "Dietary Approaches to Stop Hypertension." It is a healthy eating plan that has been shown to reduce high blood pressure (hypertension) in as little as 14 days, while also possibly providing other significant health benefits. These other health benefits include reducing the risk of breast cancer after menopause and reducing the risk of type 2 diabetes, heart disease, colon cancer, and stroke. Health benefits also include weight loss and slowing kidney failure in patients with chronic kidney disease.  DIET GUIDELINES  Limit salt (sodium). Your diet should contain less than 1500 mg of sodium daily.  Limit refined or processed carbohydrates. Your diet should include mostly whole grains. Desserts and added sugars should be used sparingly.  Include small amounts of heart-healthy fats. These types of fats include nuts, oils, and tub margarine. Limit saturated and trans fats. These fats have been shown to be harmful in the body. CHOOSING FOODS  The following food groups are based on a 2000 calorie diet. See your Registered Dietitian for individual calorie needs. Grains and Grain Products (6 to 8 servings daily)  Eat More Often: Whole-wheat bread, brown rice, whole-grain or wheat  pasta, quinoa, popcorn without added fat or salt (air popped).  Eat Less Often: White bread, white pasta, white rice, cornbread. Vegetables (4 to 5 servings daily)  Eat More Often: Fresh, frozen, and canned vegetables. Vegetables may be raw, steamed, roasted, or grilled with a minimal amount of fat.  Eat Less Often/Avoid: Creamed or fried vegetables. Vegetables in a cheese sauce. Fruit (4 to 5 servings daily)  Eat More Often: All fresh, canned (in natural juice), or frozen fruits. Dried fruits without added sugar. One hundred percent fruit juice ( cup [237 mL] daily).  Eat Less Often: Dried fruits with added sugar. Canned fruit in light or heavy syrup. [employer], Fish, and Poultry (2 servings or less daily. One serving is 3 to 4 oz [85-114 g]).  Eat More Often: Ninety percent or leaner ground beef, tenderloin, sirloin. Round cuts of beef, chicken breast, [place] breast. All fish. Grill, bake, or broil your meat. Nothing should be fried.  Eat Less Often/Avoid: Fatty cuts of meat, [place], or chicken leg, thigh, or wing. Fried cuts of meat or fish. Dairy (2 to 3 servings)  Eat More Often: Low-fat or fat-free milk, low-fat plain or light yogurt, reduced-fat or part-skim cheese.  Eat Less Often/Avoid: Milk (whole, 2%).Whole milk yogurt. Full-fat cheeses. Nuts, Seeds, and Legumes (4 to 5 servings per week)  Eat More Often: All without added salt.  Eat Less Often/Avoid: Salted nuts and seeds, canned beans with added salt. Fats and Sweets (limited)  Eat More Often: Vegetable oils, tub margarines without trans fats, sugar-free gelatin. Mayonnaise and salad dressings.  Eat Less Often/Avoid: Coconut oils, palm oils, butter, stick margarine, cream, half and half, cookies, candy, pie. FOR MORE  INFORMATION The Dash Diet Eating Plan: www.dashdiet.org Document Released: 04/09/2011 Document Revised: 07/13/2011 Document Reviewed: 04/09/2011 [facility] Patient Information 2014 [place], [place].

## 2012-11-12 LAB — LIPID PANEL
HDL: 29 mg/dL — ABNORMAL LOW (ref >39)
LDL Cholesterol: 111 mg/dL — ABNORMAL HIGH (ref 0–99)
Total CHOL/HDL Ratio: 6.6 Ratio
VLDL: 51 mg/dL — ABNORMAL HIGH (ref 0–40)

## 2012-11-16 DIAGNOSIS — M5416 Radiculopathy, lumbar region: Secondary | ICD-10-CM | POA: Insufficient documentation

## 2012-11-16 DIAGNOSIS — IMO0001 Reserved for inherently not codable concepts without codable children: Secondary | ICD-10-CM | POA: Insufficient documentation

## 2012-11-16 DIAGNOSIS — F411 Generalized anxiety disorder: Secondary | ICD-10-CM | POA: Insufficient documentation

## 2012-11-17 DIAGNOSIS — G4733 Obstructive sleep apnea (adult) (pediatric): Secondary | ICD-10-CM

## 2012-11-26 ENCOUNTER — Telehealth: Payer: Self-pay

## 2012-11-26 DIAGNOSIS — M545 Low back pain, unspecified: Secondary | ICD-10-CM

## 2012-11-26 NOTE — Telephone Encounter (Signed)
Patient would like to know if there is a substitute for Lidocaine due to it being to expensive. If there is something cheaper he would like it sent to [facility] [address]. Pt can be reached at [phone].

## 2012-11-28 NOTE — Telephone Encounter (Signed)
Unfortunately, every indication looks like this is going to be chronic pain.  If he is requiring more narcotics to take care of it, then we should start working on a referral to pain management as his narcotic requirement is only going to increase.  If pt agrees, please place referral.

## 2012-11-28 NOTE — Telephone Encounter (Signed)
Insurance approval is pending for MRI scan. He states he has tried the cream and it did not help. He states he is not able to afford the patch. He is asking for more Hydrocodone since he can not afford the lidocaine patch. They are $250.

## 2012-11-28 NOTE — Telephone Encounter (Signed)
No alternative med available. There are some effective topicals lotions/creams that can be compounded however, so if we would like to look into this - [facility] I thinks, we certainly can. Did he get his MRI scheduled?

## 2012-11-28 NOTE — Telephone Encounter (Signed)
In addition - pt had refills on the norco which he can use but he will need an OV for any additional rxs after those refills run out.

## 2012-11-29 NOTE — Telephone Encounter (Signed)
Called him, he was agreeable to pain management. I have put in the referral for him, he states his MRI was declined due to lack of conservative care. I told him I will call the insurance company back on this, because he has had enough conservative care to qualify for the scan at this point. He will call me back and give me the case # off the papers so I can call. To you FYI

## 2012-12-01 NOTE — Telephone Encounter (Signed)
Received notice of denial of MRI from [facility]. Called them to provide add'l info and was told that the case closed d/t not having enough clinical info. When I questioned the fact that we were never contacted asking for add'l info I was advised that their notes say that they TRIED to call us on 11/14/12 but were unable to get through and therefore it was denied. I was connected to Appeals dept to [facility] for them to CB so that I can proceed with the appeal.  Called pt to advise of status and that he doesn't need to CB w/case #.

## 2012-12-06 NOTE — Telephone Encounter (Signed)
When patient provides me the information regarding the denial. We can appeal. Waiting on him.

## 2012-12-09 ENCOUNTER — Encounter: Payer: Self-pay | Admitting: Physical Medicine & Rehabilitation

## 2012-12-12 ENCOUNTER — Other Ambulatory Visit: Payer: Self-pay | Admitting: Family Medicine

## 2012-12-12 DIAGNOSIS — M5416 Radiculopathy, lumbar region: Secondary | ICD-10-CM

## 2012-12-18 ENCOUNTER — Other Ambulatory Visit: Payer: BC Managed Care – PPO

## 2012-12-23 ENCOUNTER — Ambulatory Visit
Admission: RE | Admit: 2012-12-23 | Discharge: 2012-12-23 | Disposition: A | Discharge status: Home / Self Care | Payer: BC Managed Care – PPO | Source: Ambulatory Visit | Attending: Family Medicine | Admitting: Family Medicine

## 2012-12-23 DIAGNOSIS — M5416 Radiculopathy, lumbar region: Secondary | ICD-10-CM

## 2012-12-23 NOTE — Telephone Encounter (Signed)
Please follow-up - did pt forget to give [place] the case #? I guess we never heard back from the appeals department?

## 2012-12-26 ENCOUNTER — Telehealth: Payer: Self-pay

## 2012-12-26 NOTE — Telephone Encounter (Signed)
Patient is calling for his MRI results.   [phone]

## 2012-12-27 NOTE — Telephone Encounter (Signed)
Was approved, after next attempt. Has been done, patient called with results.

## 2012-12-27 NOTE — Telephone Encounter (Signed)
Patient has been notified

## 2013-01-09 ENCOUNTER — Encounter: Payer: BC Managed Care – PPO | Attending: Physical Medicine & Rehabilitation

## 2013-01-09 ENCOUNTER — Ambulatory Visit: Payer: BC Managed Care – PPO | Admitting: Physical Medicine & Rehabilitation

## 2013-01-23 ENCOUNTER — Telehealth: Payer: Self-pay

## 2013-01-23 NOTE — Telephone Encounter (Signed)
HYDROcodone-acetaminophen (NORCO/VICODIN) 5-325 MG per tablet  Refill request   [phone]

## 2013-01-26 NOTE — Telephone Encounter (Signed)
On 7/11 I gave pt a 3 month supply of norco so should not need a refill until 10/11.  He is being referred to see pain management so should get future medications through them.  If for some reason, he needs a refill from me, he needs to come in for an OV for that to assess his use and the future plan as far as his narcotic dependency.

## 2013-01-26 NOTE — Telephone Encounter (Signed)
Patient advised.

## 2013-01-26 NOTE — Telephone Encounter (Signed)
Patient is calling to check the status of a refill on Hydrocodone. Patient uses [facility] on [address].  [phone].

## 2013-01-27 ENCOUNTER — Other Ambulatory Visit: Payer: Self-pay | Admitting: Family Medicine

## 2013-01-27 NOTE — Telephone Encounter (Signed)
Needs OV for additional refills.

## 2013-02-10 ENCOUNTER — Encounter: Payer: Self-pay | Admitting: Family Medicine

## 2013-02-10 ENCOUNTER — Ambulatory Visit (INDEPENDENT_AMBULATORY_CARE_PROVIDER_SITE_OTHER): Payer: BC Managed Care – PPO | Admitting: Family Medicine

## 2013-02-10 VITALS — BP 142/100 | HR 104 | Temp 97.9°F | Resp 18 | Ht 68.0 in | Wt 294.4 lb

## 2013-02-10 DIAGNOSIS — IMO0002 Reserved for concepts with insufficient information to code with codable children: Secondary | ICD-10-CM

## 2013-02-10 DIAGNOSIS — G4733 Obstructive sleep apnea (adult) (pediatric): Secondary | ICD-10-CM

## 2013-02-10 DIAGNOSIS — E8881 Metabolic syndrome: Secondary | ICD-10-CM

## 2013-02-10 DIAGNOSIS — I10 Essential (primary) hypertension: Secondary | ICD-10-CM

## 2013-02-10 DIAGNOSIS — F411 Generalized anxiety disorder: Secondary | ICD-10-CM

## 2013-02-10 DIAGNOSIS — Z23 Encounter for immunization: Secondary | ICD-10-CM

## 2013-02-10 DIAGNOSIS — M5416 Radiculopathy, lumbar region: Secondary | ICD-10-CM

## 2013-02-10 DIAGNOSIS — E781 Pure hyperglyceridemia: Secondary | ICD-10-CM

## 2013-02-10 MED ORDER — CITALOPRAM HYDROBROMIDE 20 MG PO TABS: 20.0000 mg | ORAL_TABLET | Freq: Every day | ORAL | Status: DC

## 2013-02-10 MED ORDER — HYDROCODONE-ACETAMINOPHEN 5-325 MG PO TABS: 2.0000 | ORAL_TABLET | Freq: Every day | ORAL | Status: DC | PRN

## 2013-02-10 MED ORDER — METHOCARBAMOL 750 MG PO TABS: 750.0000 mg | ORAL_TABLET | Freq: Four times a day (QID) | ORAL | Status: DC

## 2013-02-10 MED ORDER — CLONAZEPAM 1 MG PO TABS: ORAL_TABLET | ORAL | Status: DC

## 2013-02-10 MED ORDER — LISINOPRIL-HYDROCHLOROTHIAZIDE 10-12.5 MG PO TABS: 1.0000 | ORAL_TABLET | Freq: Every day | ORAL | Status: DC

## 2013-02-10 NOTE — Patient Instructions (Signed)
Foods Rich in Potassium Food / Potassium (mg)  Apricots, dried,  cup / 378 mg   Apricots, raw, 1 cup halves / 401 mg   Avocado,  / 487 mg   Banana, 1 large / 487 mg   Beef, lean, round, 3 oz / 202 mg   Cantaloupe, 1 cup cubes / 427 mg   Dates, medjool, 5 whole / 835 mg   Ham, cured, 3 oz / 212 mg   Lentils, dried,  cup / 458 mg   Lima beans, frozen,  cup / 258 mg   Orange, 1 large / 333 mg   Orange juice, 1 cup / 443 mg   Peaches, dried,  cup / 398 mg   Peas, split, cooked,  cup / 355 mg   Potato, boiled, 1 medium / 515 mg   Prunes, dried, uncooked,  cup / 318 mg   Raisins,  cup / 309 mg   Salmon, pink, raw, 3 oz / 275 mg   Sardines, canned , 3 oz / 338 mg   Tomato, raw, 1 medium / 292 mg   Tomato juice, 6 oz / 417 mg   [place], 3 oz / 349 mg  Document Released: 04/20/2005 Document Revised: 12/31/2010 Document Reviewed: 09/03/2008 [facility] Patient Information 2012 [place], [place].  DASH Diet The DASH diet stands for "Dietary Approaches to Stop Hypertension." It is a healthy eating plan that has been shown to reduce high blood pressure (hypertension) in as little as 14 days, while also possibly providing other significant health benefits. These other health benefits include reducing the risk of breast cancer after menopause and reducing the risk of type 2 diabetes, heart disease, colon cancer, and stroke. Health benefits also include weight loss and slowing kidney failure in patients with chronic kidney disease.  DIET GUIDELINES  Limit salt (sodium). Your diet should contain less than 1500 mg of sodium daily.  Limit refined or processed carbohydrates. Your diet should include mostly whole grains. Desserts and added sugars should be used sparingly.  Include small amounts of heart-healthy fats. These types of fats include nuts, oils, and tub margarine. Limit saturated and trans fats. These fats have been shown to be harmful in the body. CHOOSING FOODS   The following food groups are based on a 2000 calorie diet. See your Registered Dietitian for individual calorie needs. Grains and Grain Products (6 to 8 servings daily)  Eat More Often: Whole-wheat bread, brown rice, whole-grain or wheat pasta, quinoa, popcorn without added fat or salt (air popped).  Eat Less Often: White bread, white pasta, white rice, cornbread. Vegetables (4 to 5 servings daily)  Eat More Often: Fresh, frozen, and canned vegetables. Vegetables may be raw, steamed, roasted, or grilled with a minimal amount of fat.  Eat Less Often/Avoid: Creamed or fried vegetables. Vegetables in a cheese sauce. Fruit (4 to 5 servings daily)  Eat More Often: All fresh, canned (in natural juice), or frozen fruits. Dried fruits without added sugar. One hundred percent fruit juice ( cup [237 mL] daily).  Eat Less Often: Dried fruits with added sugar. Canned fruit in light or heavy syrup. [employer], Fish, and Poultry (2 servings or less daily. One serving is 3 to 4 oz [85-114 g]).  Eat More Often: Ninety percent or leaner ground beef, tenderloin, sirloin. Round cuts of beef, chicken breast, [place] breast. All fish. Grill, bake, or broil your meat. Nothing should be fried.  Eat Less Often/Avoid: Fatty cuts of meat, [place], or chicken leg, thigh,  or wing. Fried cuts of meat or fish. Dairy (2 to 3 servings)  Eat More Often: Low-fat or fat-free milk, low-fat plain or light yogurt, reduced-fat or part-skim cheese.  Eat Less Often/Avoid: Milk (whole, 2%).Whole milk yogurt. Full-fat cheeses. Nuts, Seeds, and Legumes (4 to 5 servings per week)  Eat More Often: All without added salt.  Eat Less Often/Avoid: Salted nuts and seeds, canned beans with added salt. Fats and Sweets (limited)  Eat More Often: Vegetable oils, tub margarines without trans fats, sugar-free gelatin. Mayonnaise and salad dressings.  Eat Less Often/Avoid: Coconut oils, palm oils, butter, stick margarine, cream, half  and half, cookies, candy, pie. FOR MORE INFORMATION The Dash Diet Eating Plan: www.dashdiet.org Document Released: 04/09/2011 Document Revised: 07/13/2011 Document Reviewed: 04/09/2011 [facility] Patient Information 2014 [place], [place].

## 2013-02-10 NOTE — Progress Notes (Signed)
Subjective:    Patient ID: Eric Hardy, male    DOB: 1985/05/18, 27 y.o.   MRN: [id number] Chief Complaint  Patient presents with  . Follow-up    MRI  . Medication Refill    HPI Chest always feels pretty tight. Can't get a deep breath in ever. Is worried about the borderline cardiomegaly noted on previous CXR.  Using CPAP 4-5 nights/wk and does sleep better when he uses it.  Does not check BP outside the office.  Feels that his mood is good. Not feeling anxious or having panic attacks any longer.  If there is nothing wrong with his back on the MRI, wondering if it will be financially worth it for him to keep the appt with Dr. Wynn Banker.  He is no longer taking the diclofenac or the gabapentin as they didn't help at all. For him to get any relief from the back pain, he has to take a hydrocodone 5 tid and this only provides mild temporary relief.  The bottom of his back feels like 2 hands just grabbing and tightening and keeping him from movement.  Has not tried any muscle relaxants other than the valium I initially rx'ed him.  None of the medications make him sleepy or feel out of it.  THe gabapentin and diclofenac didn't help at all so stopped taking. The lidoderm patches wouldn't stick during work.  No past medical history on file. No current outpatient prescriptions on file prior to visit.   No current facility-administered medications on file prior to visit.   No Known Allergies  Review of Systems  Constitutional: Negative for fever and chills.  Respiratory: Positive for chest tightness and shortness of breath. Negative for cough, choking and wheezing.   Cardiovascular: Positive for palpitations. Negative for chest pain.  Gastrointestinal: Negative for abdominal pain, diarrhea and constipation.  Genitourinary: Negative for urgency, frequency, decreased urine volume and difficulty urinating.  Musculoskeletal: Positive for arthralgias, back pain, gait problem and myalgias.  Negative for joint swelling.  Skin: Negative for color change and wound.  Neurological: Positive for weakness and numbness. Negative for dizziness and light-headedness.  Psychiatric/Behavioral: Negative for sleep disturbance and dysphoric mood. The patient is not nervous/anxious.       BP 142/100  Pulse 104  Temp(Src) 97.9 F (36.6 C) (Oral)  Resp 18  Ht 5\' 8"  (1.727 m)  Wt 294 lb 6.4 oz (133.539 kg)  BMI 44.77 kg/m2  SpO2 98% Objective:   Physical Exam  Constitutional: He is oriented to person, place, and time. He appears well-developed and well-nourished. No distress.  HENT:  Head: Normocephalic and atraumatic.  Eyes: Conjunctivae are normal. Pupils are equal, round, and reactive to light. No scleral icterus.  Neck: Normal range of motion. Neck supple. No thyromegaly present.  Cardiovascular: Normal rate, regular rhythm, normal heart sounds and intact distal pulses.   Pulmonary/Chest: Effort normal and breath sounds normal. No respiratory distress.  Musculoskeletal: He exhibits no edema.  Lymphadenopathy:    He has no cervical adenopathy.  Neurological: He is alert and oriented to person, place, and time.  Skin: Skin is warm and dry. He is not diaphoretic.  Psychiatric: He has a normal mood and affect. His behavior is normal.      Assessment & Plan:  Need for prophylactic vaccination and inoculation against influenza - Plan: Flu Vaccine QUAD 36+ mos IM  Hypertension - start lisinopril/hctz low dose. Recheck at f/u with cmp.  Severe obstructive sleep apnea - wearing CPAP with moderate  compliance  Morbid obesity - Unfortunately, I suspect that pt's sensation of chronic chest tightness and unable to breath deep is due to his body habitus with large chest/abd circumference.  Could consider spirometry with trial of neb at f/u (no prior pfts in flowsheet). Could consider echo if sxs worsen.  Generalized anxiety disorder  Lumbar radiculopathy, chronic - Encouraged pt to keep  appt with Dr. Wynn Banker for an expert opinion in how he could best sustain pain relief.  I do not think we will ever be successful in helping him with his pain in any meaningful way if we depend purely on medications.  Due to his BMI and body habitus, it is unfortunate that he may need higher doses of medications to reach a therapeutic effect but I am reluctant to increase the hydrocodone any further despite him needing more than rx'd as this will not make any long-term improvement but is highly likely to cause long-term problems of tolerance/dependence.  Failed gabapentin, diclofenac, valium, prednisone, tramadol, capsaicin cream, and lidoderm.  I think he may benefit from either trigger point injections or PT or manual therapy with massage and myofascial release - However, he is limited by his job and cost.  Try prn methocarbamol and refilled hydrocodone at some dose of #30/mo prn.  HPL - Pt at high risk for diabetes - does have metabolic syndrome.  Recheck flp at f/u to see if any improvements and rec a1c at that time as well.   Meds ordered this encounter  Medications  . methocarbamol (ROBAXIN) 750 MG tablet    Sig: Take 1 tablet (750 mg total) by mouth 4 (four) times daily.    Dispense:  120 tablet    Refill:  0  . DISCONTD: HYDROcodone-acetaminophen (NORCO/VICODIN) 5-325 MG per tablet    Sig: Take 2 tablets by mouth daily as needed (severe pain).    Dispense:  30 tablet    Refill:  0  . citalopram (CELEXA) 20 MG tablet    Sig: Take 1 tablet (20 mg total) by mouth daily.    Dispense:  90 tablet    Refill:  1  . clonazePAM (KLONOPIN) 1 MG tablet    Sig: TAKE ONE TABLET BY MOUTH TWICE DAILY AS NEEDED FOR ANXIETY    Dispense:  60 tablet    Refill:  2    Needs OV for additional refills  . lisinopril-hydrochlorothiazide (PRINZIDE,ZESTORETIC) 10-12.5 MG per tablet    Sig: Take 1 tablet by mouth daily.    Dispense:  90 tablet    Refill:  0  . DISCONTD: HYDROcodone-acetaminophen  (NORCO/VICODIN) 5-325 MG per tablet    Sig: Take 2 tablets by mouth daily as needed (severe pain). May fill on or after 03/12/13.    Dispense:  30 tablet    Refill:  0  . HYDROcodone-acetaminophen (NORCO/VICODIN) 5-325 MG per tablet    Sig: Take 2 tablets by mouth daily as needed (severe pain). May fill on or after 04/10/13.    Dispense:  30 tablet    Refill:  0

## 2013-02-17 ENCOUNTER — Ambulatory Visit: Payer: BC Managed Care – PPO | Admitting: Family Medicine

## 2013-02-24 ENCOUNTER — Ambulatory Visit: Payer: BC Managed Care – PPO | Admitting: Physical Medicine & Rehabilitation

## 2013-03-20 ENCOUNTER — Encounter: Payer: Self-pay | Admitting: Physical Medicine & Rehabilitation

## 2013-03-20 ENCOUNTER — Encounter: Payer: BC Managed Care – PPO | Attending: Physical Medicine & Rehabilitation

## 2013-03-20 ENCOUNTER — Ambulatory Visit (HOSPITAL_BASED_OUTPATIENT_CLINIC_OR_DEPARTMENT_OTHER): Payer: BC Managed Care – PPO | Admitting: Physical Medicine & Rehabilitation

## 2013-03-20 VITALS — BP 150/79 | HR 118 | Resp 16 | Ht 70.0 in | Wt 304.0 lb

## 2013-03-20 DIAGNOSIS — Z5181 Encounter for therapeutic drug level monitoring: Secondary | ICD-10-CM

## 2013-03-20 DIAGNOSIS — M545 Low back pain, unspecified: Secondary | ICD-10-CM | POA: Insufficient documentation

## 2013-03-20 DIAGNOSIS — M79609 Pain in unspecified limb: Secondary | ICD-10-CM | POA: Insufficient documentation

## 2013-03-20 DIAGNOSIS — M549 Dorsalgia, unspecified: Secondary | ICD-10-CM | POA: Insufficient documentation

## 2013-03-20 DIAGNOSIS — M47816 Spondylosis without myelopathy or radiculopathy, lumbar region: Secondary | ICD-10-CM

## 2013-03-20 DIAGNOSIS — Z79899 Other long term (current) drug therapy: Secondary | ICD-10-CM

## 2013-03-20 DIAGNOSIS — M5380 Other specified dorsopathies, site unspecified: Secondary | ICD-10-CM

## 2013-03-20 NOTE — Progress Notes (Signed)
Subjective:    Patient ID: Eric Hardy, male    DOB: 10-Nov-1985, 27 y.o.   MRN: [id number]  HPI Chief complaint is low back pain radiating into the thighs and lower leg.  27 year old male with a 5 year history of low back pain going into the lateral thighs and lateral legs. Had an episode lifting something off a truck with sharp right-sided back pain going into the right thigh About 5 years ago. No recent injury. No recent fevers or infections. MRI of the lumbar spine was performed in August of 2014. Results are outlined below. No findings that would explain symptoms.  Has not had any physical therapy, has not seen a chiropractor, has not had any injections. Tried some hydrocodone as well as Valium.  Has tried over-the-counter medications ibuprofen, Aleve, Tylenol have not been helpful, tried tramadol Pain Inventory Average Pain 7 Pain Right Now 6 My pain is intermittent, burning, dull, stabbing and tingling  In the last 24 hours, has pain interfered with the following? General activity 7 Relation with others 8 Enjoyment of life 9 What TIME of day is your pain at its worst? daytime Sleep (in general) Fair  Pain is worse with: walking and standing Pain improves with: medication Relief from Meds: 7  Mobility walk without assistance how many minutes can you walk? 5 ability to climb steps?  yes do you drive?  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 Symptoms are chronic.  MRI LUMBAR SPINE WITHOUT CONTRAST  Technique: Multiplanar and multiecho pulse sequences of the lumbar  spine were obtained without intravenous contrast.  Comparison: Plain films lumbar spine 09/30/2012.  Findings: Vertebral body height and alignment are normal.  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 There is no worrisome marrow lesion.  No pars interarticularis defect is identified. The conus  medullaris is normal in signal and position. Imaged intra-  abdominal contents are unremarkable.  T11-12 and T12-L1 levels are imaged in the sagittal plane only and  negative.  L1-2: Negative.  L2-3: Negative.  L3-4: Negative.  L4-5: Negative.  L5-S1: Minimal disc bulge without central canal or foraminal  stenosis.  IMPRESSION:  No finding to explain the patient's symptoms. Minimal disc bulging  L5-S1 is noted.  Physicians involved in your care [facility]   Family History  Problem Relation Age of Onset  . Hypertension Maternal Grandmother   . Heart attack Maternal Grandfather   . Hypertension Paternal Grandmother   . Hypertension Paternal Grandfather    History   Social History  . Marital Status: Divorced    Spouse Name: N/A    Number of Children: N/A  . Years of Education: N/A   Social History Main Topics  . Smoking status: Never Smoker   . Smokeless tobacco: Never Used  . Alcohol Use: Yes     Comment: socially  . Drug Use: No  . Sexual Activity: Yes   Other Topics Concern  . None   Social History Narrative  . None   History reviewed. No pertinent past surgical history. Past Medical History  Diagnosis Date  . Hypertension    BP 150/79  Pulse 118  Resp 16  Ht 5\' 10"  (1.778 m)  Wt 304 lb (137.893 kg)  BMI 43.62 kg/m2  SpO2 98%  Review of Systems  Respiratory: Positive for apnea.   Musculoskeletal: Positive for back pain, gait problem and neck pain.  Neurological: Positive for weakness and numbness.  All other systems reviewed and are negative.       Objective:   Physical Exam  Nursing note and vitals reviewed. Constitutional: He is oriented to person, place, and time. He appears well-developed.  obese  HENT:  Head: Normocephalic and atraumatic.  Eyes: Conjunctivae and EOM are normal. Pupils are equal,  round, and reactive to light.  Neck: Normal range of motion.  Cardiovascular: Normal heart sounds.   tachy  Pulmonary/Chest: Effort normal and breath sounds normal.  Musculoskeletal:       Right hip: Normal.       Left hip: Normal.       Right knee: Normal.       Left knee: Normal.       Lumbar back: He exhibits decreased range of motion. He exhibits no edema, no deformity and no spasm.  Pain with lumbar extension and limited range of motion  Normal lumbar flexion without pain    Neurological: He is alert and oriented to person, place, and time. He has normal strength and normal reflexes. No sensory deficit.  Psychiatric: He has a normal mood and affect.  Posture is lordotic     Assessment & Plan:  1.  Lumbar pain with radiating symptoms, no significant radiologic findings.  Given obesity may be related to facet syndrome because of increased loading in a lordotic posture.   Will first try PT for neuromuscular re education If this fails, would try L3,4,5 MBB or chiropractic as second line  Do not recommend  Narcotics given lack of significant pathology as well as age and Pickwickian syndrome Also discussed weight loss as an effective long term strategy

## 2013-03-20 NOTE — Patient Instructions (Signed)
Please attend physical therapy 2- 3 times a week for 3 weeks. Then you will need to keep up with year home exercises on a regular basis from then on  The therapy office will call you to set up appointment.  I do not recommend narcotic pain medicine Senior situation since she have a normal MRI as well as your age  If the therapy is not helpful I would recommend chiropractic care and if that is not helpful then injections

## 2013-03-28 ENCOUNTER — Telehealth: Payer: Self-pay | Admitting: *Deleted

## 2013-03-28 NOTE — Telephone Encounter (Signed)
Message copied by Doreene Eland on Tue Mar 28, 2013  9:38 AM ------      Message from: Su Monks      Created: Mon Mar 27, 2013 12:23 PM       Patient should be at least NON narcotic, she is a new patient , please also let Dr. Doroteo Bradford know ------

## 2013-03-28 NOTE — Telephone Encounter (Signed)
Left VM request to call the office (to discuss UDS results)

## 2013-03-28 NOTE — Telephone Encounter (Signed)
Let second message to return call to office so we can discuss UDS

## 2013-03-28 NOTE — Progress Notes (Signed)
agree

## 2013-04-17 ENCOUNTER — Ambulatory Visit: Payer: BC Managed Care – PPO | Admitting: Physical Medicine & Rehabilitation

## 2013-05-19 ENCOUNTER — Ambulatory Visit: Payer: BC Managed Care – PPO | Admitting: Family Medicine

## 2013-05-22 ENCOUNTER — Ambulatory Visit (INDEPENDENT_AMBULATORY_CARE_PROVIDER_SITE_OTHER): Payer: BC Managed Care – PPO | Admitting: Family Medicine

## 2013-05-22 VITALS — BP 118/82 | HR 112 | Temp 97.9°F | Resp 18 | Ht 68.5 in | Wt 303.0 lb

## 2013-05-22 DIAGNOSIS — Z79899 Other long term (current) drug therapy: Secondary | ICD-10-CM

## 2013-05-22 DIAGNOSIS — M549 Dorsalgia, unspecified: Secondary | ICD-10-CM

## 2013-05-22 DIAGNOSIS — I10 Essential (primary) hypertension: Secondary | ICD-10-CM

## 2013-05-22 DIAGNOSIS — F411 Generalized anxiety disorder: Secondary | ICD-10-CM

## 2013-05-22 MED ORDER — METHOCARBAMOL 750 MG PO TABS: 750.0000 mg | ORAL_TABLET | Freq: Four times a day (QID) | ORAL | Status: DC

## 2013-05-22 MED ORDER — CLONAZEPAM 1 MG PO TABS: ORAL_TABLET | ORAL | Status: DC

## 2013-05-22 MED ORDER — HYDROCODONE-ACETAMINOPHEN 5-325 MG PO TABS: 2.0000 | ORAL_TABLET | Freq: Every day | ORAL | Status: DC | PRN

## 2013-05-22 MED ORDER — CITALOPRAM HYDROBROMIDE 20 MG PO TABS: 20.0000 mg | ORAL_TABLET | Freq: Every day | ORAL | Status: DC

## 2013-05-22 MED ORDER — LISINOPRIL-HYDROCHLOROTHIAZIDE 10-12.5 MG PO TABS: 1.0000 | ORAL_TABLET | Freq: Every day | ORAL | Status: DC

## 2013-05-22 NOTE — Progress Notes (Signed)
Subjective:    Patient ID: Eric Hardy, male    DOB: 03/07/1986, 28 y.o.   MRN: [id number] Chief Complaint  PatiGarnet Sierrasent presents with  . rx refills    celexa, klonopin, vicodin, lisinopril, robaxin   This chart was scribed for Norberto SorensonEva Shaw, MD by Nicholos Johnsenise Iheanachor, Medical Scribe. This patient's care was started at 6:08 PM.  HPI HPI Comments: Eric SierrasVirgil L Hardy is a 28 y.o. male   Since last visit, has been seen by Dr. Wynn BankerKirsteins once. Dr. Wynn BankerKirsteins thought low back pain may be due to facet syndrome, recommended physical therapy for neuromuscular reeducation. If that fails, consider L 3-4-5 branch blocks or chiropractor. Dr. Wynn BankerKirsteins recommend staying away from narcotics for treatment considering his young age, chronic condition, and obesity (poss Pickwickian syndrome). Drug screen performed on 03/17/13 which returned positive for cocaine. No opiates were found in his urine. Pt states it is a one time thing - his urine today would be neg for cocaine - knows he cannot continue if he wants to receive rx narcotics. States he has been completely out of hydrocodone so it will still not be in his urine today.  Pt states he is doing well. States Dr. Jodean LimaKirstein's wanted him to do physical therapy and he has now declined due to time constraints. He states he called the office and told them this - has not heard back on whether they still want to consider injections or not if he doesn't try the PT.  Pt is using a C-Pap approximately 5x/week. Pt admits he feels more well rested when he wakes up in the morning. Less fatigue. Mood better.  Pt has been compliant with his Lisinopril medication. Denies cest pain.  Past Medical History  Diagnosis Date  . Hypertension    Current Outpatient Prescriptions on File Prior to Visit  Medication Sig Dispense Refill  . citalopram (CELEXA) 20 MG tablet Take 1 tablet (20 mg total) by mouth daily.  90 tablet  1  . clonazePAM (KLONOPIN) 1 MG tablet TAKE ONE TABLET BY  MOUTH TWICE DAILY AS NEEDED FOR ANXIETY  60 tablet  2  . HYDROcodone-acetaminophen (NORCO/VICODIN) 5-325 MG per tablet Take 2 tablets by mouth daily as needed (severe pain). May fill on or after 04/10/13.  30 tablet  0  . lisinopril-hydrochlorothiazide (PRINZIDE,ZESTORETIC) 10-12.5 MG per tablet Take 1 tablet by mouth daily.  90 tablet  0  . methocarbamol (ROBAXIN) 750 MG tablet Take 1 tablet (750 mg total) by mouth 4 (four) times daily.  120 tablet  0   No current facility-administered medications on file prior to visit.   No Known Allergies  Review of Systems  Constitutional: Negative for fever and chills.  Respiratory: Negative for cough, chest tightness, shortness of breath and wheezing.   Cardiovascular: Negative for chest pain.  Gastrointestinal: Negative for abdominal pain, diarrhea and constipation.  Genitourinary: Negative for urgency, frequency, decreased urine volume and difficulty urinating.  Musculoskeletal: Positive for arthralgias, back pain and myalgias. Negative for gait problem and joint swelling.  Skin: Negative for color change and wound.  Neurological: Negative for dizziness, weakness, light-headedness and numbness.  Psychiatric/Behavioral: Negative for sleep disturbance and dysphoric mood. The patient is nervous/anxious.    BP 118/82  Pulse 112  Temp(Src) 97.9 F (36.6 C) (Oral)  Resp 18  Ht 5' 8.5" (1.74 m)  Wt 303 lb (137.44 kg)  BMI 45.40 kg/m2  SpO2 99%  Objective:   Physical Exam  Nursing note and vitals reviewed. Constitutional: He  is oriented to person, place, and time. He appears well-developed and well-nourished. No distress.  HENT:  Head: Normocephalic and atraumatic.  Eyes: EOM are normal.  Neck: Neck supple. No tracheal deviation present. No mass and no thyromegaly present.  Cardiovascular: Normal rate, regular rhythm and normal heart sounds.  Exam reveals no gallop and no friction rub.   No murmur heard. Pulmonary/Chest: Effort normal and  breath sounds normal. No respiratory distress. He has no wheezes. He has no rales.  Musculoskeletal: Normal range of motion.  Lymphadenopathy:    He has no cervical adenopathy.  Neurological: He is alert and oriented to person, place, and time.  Skin: Skin is warm and dry.  Psychiatric: He has a normal mood and affect. His behavior is normal.   Assessment & Plan:  Back pain - Plan: Ambulatory referral to Pain Clinic - Pt adament that he doesn't have time or $$ to do PT. Would be willing to try injections but not really wanting to. Would rather just continue on narcotic and pain medications for his chronic back pain - likely due to facet syndrome. We cannot provide chronic pain management here and he was seen at the [facility] by Dr. Wynn Banker for non-narcotic therapy only - will try for referral to other clinic like [facility] for this. Pt given 2 mo supply of vicodin to give him time to establish with a new pain management provider. If for some reason pt cannot establish in the next 2 mos, will absolutely not get refill w/o OV and UDS.  Reviewed [facility] and pt has had no controlled medicines from any other providers other than myself. Last filled Vicodin 5-325 mg #30 prescriptions on 10/11,12/2, and 12/30 of 2014.  Morbid obesity  Hypertension - much improved, cont on lisinopril-hctz.  Generalized anxiety disorder - using Clonazepam 1 mg 12 60; last filled 05/01/13. Refill provided. Needs OV for any additional refills. Cont celexa.  Encounter for long-term (current) use of other medications - Plan: Comprehensive metabolic panel  Meds ordered this encounter  Medications  . DISCONTD: HYDROcodone-acetaminophen (NORCO/VICODIN) 5-325 MG per tablet    Sig: Take 2 tablets by mouth daily as needed (severe pain). May fill on or after 05/22/13.    Dispense:  30 tablet    Refill:  0  . HYDROcodone-acetaminophen (NORCO/VICODIN) 5-325 MG per tablet    Sig: Take 2 tablets by  mouth daily as needed (severe pain). May fill on or after 06/21/13.    Dispense:  30 tablet    Refill:  0  . citalopram (CELEXA) 20 MG tablet    Sig: Take 1 tablet (20 mg total) by mouth daily.    Dispense:  90 tablet    Refill:  1  . lisinopril-hydrochlorothiazide (PRINZIDE,ZESTORETIC) 10-12.5 MG per tablet    Sig: Take 1 tablet by mouth daily.    Dispense:  90 tablet    Refill:  1  . methocarbamol (ROBAXIN) 750 MG tablet    Sig: Take 1 tablet (750 mg total) by mouth 4 (four) times daily.    Dispense:  120 tablet    Refill:  0  . clonazePAM (KLONOPIN) 1 MG tablet    Sig: TAKE ONE TABLET BY MOUTH TWICE DAILY AS NEEDED FOR ANXIETY    Dispense:  60 tablet    Refill:  2    Needs OV for additional refills    I personally performed the services described in this documentation, which was scribed in my presence.  The recorded information has been reviewed and considered, and addended by me as needed.  Norberto Sorenson, MD MPH

## 2013-05-23 ENCOUNTER — Encounter: Payer: Self-pay | Admitting: Family Medicine

## 2013-05-23 LAB — COMPREHENSIVE METABOLIC PANEL
ALBUMIN: 4.3 g/dL (ref 3.5–5.2)
ALT: 42 U/L (ref 0–53)
AST: 26 U/L (ref 0–37)
Alkaline Phosphatase: 49 U/L (ref 39–117)
BILIRUBIN TOTAL: 0.5 mg/dL (ref 0.3–1.2)
BUN: 11 mg/dL (ref 6–23)
CO2: 28 meq/L (ref 19–32)
Calcium: 9.7 mg/dL (ref 8.4–10.5)
Chloride: 99 mEq/L (ref 96–112)
Creat: 1.13 mg/dL (ref 0.50–1.35)
Glucose, Bld: 93 mg/dL (ref 70–99)
POTASSIUM: 4.3 meq/L (ref 3.5–5.3)
SODIUM: 137 meq/L (ref 135–145)
TOTAL PROTEIN: 7.4 g/dL (ref 6.0–8.3)

## 2014-02-17 ENCOUNTER — Other Ambulatory Visit: Payer: Self-pay | Admitting: Family Medicine

## 2014-02-19 ENCOUNTER — Other Ambulatory Visit: Payer: Self-pay | Admitting: Family Medicine

## 2014-02-19 NOTE — Telephone Encounter (Signed)
Prescription called in to pharmacy

## 2014-05-04 DIAGNOSIS — E119 Type 2 diabetes mellitus without complications: Secondary | ICD-10-CM

## 2014-05-04 HISTORY — DX: Type 2 diabetes mellitus without complications: E11.9

## 2014-08-07 ENCOUNTER — Telehealth: Payer: Self-pay

## 2014-08-07 NOTE — Telephone Encounter (Signed)
Patient called in wanting to know about getting some weight loss medication from someone or being sent somewhere that will give him something. He would like someone to call him back and discuss these options with him. Stated he normally sees Dr. Clelia CroftShaw.   He would like to be called back at [phone]

## 2014-08-08 NOTE — Telephone Encounter (Signed)
Called pt, no answer left voicemail. Does he just need to RTC for eval and discussion? Im not sure what the criteria is to Rx these medications.

## 2014-08-08 NOTE — Telephone Encounter (Signed)
lmom to cb. 

## 2014-08-08 NOTE — Telephone Encounter (Signed)
I generally do not prescribe weight loss medications as so far all of the evidence has shown that they lead to minimal amounts of weight loss and that the weight is regained as soon as the medication is stopped and almost all of the medications have significant risks (increasing blood pressure, etc) as well as the cost of these meds is usually not covered by insurance. Every study shows that people get much bigger and more permanent effect by lifestyle change (joining a boot camp or personal trainer, weight watchers, nutrisystem, etc.)  There are information [facility] if pt is interested in surgical options.  If pt really wants medicine, there are all sorts of for-profit weightloss clinics in [place] (and I've even seen groupons for discounted visits to weightloss physicians) that will take his money and promise big results (though I have yet to meet a patient who had sustained weight loss from any meds). I think this pt had sleep apnea - until he is using his cpap machine >80% of the time, it is highly unlikely that anything will work because not getting restorative sleep will keep his metabolism really low.  The one caveat is if pt has pre-diabetes or diet-controlled diabetes, there are some medications that can help so make sure he is screened at least annually for diabetes.

## 2014-08-20 ENCOUNTER — Other Ambulatory Visit: Payer: Self-pay | Admitting: Family Medicine

## 2014-08-20 NOTE — Telephone Encounter (Signed)
Pt has not been seen here in over a yr, no refills w/o OV

## 2014-08-27 ENCOUNTER — Other Ambulatory Visit: Payer: Self-pay | Admitting: Family Medicine

## 2014-08-28 NOTE — Telephone Encounter (Signed)
Dr Clelia CroftShaw, you haven't seen this pt since Jan 2015.

## 2014-09-05 ENCOUNTER — Other Ambulatory Visit: Payer: Self-pay | Admitting: Family Medicine

## 2014-09-07 NOTE — Telephone Encounter (Signed)
Pt hasn't been seen in over a year. We had put a notice on a RF a couple of months ago, and I just OKd 15 days of two other meds w/RTC message. Please advise on this RF. I added a message to it, but did not change quantity, IF you even want to RF it at all.

## 2014-09-08 NOTE — Telephone Encounter (Signed)
No refills w/o OV on controlled meds.

## 2014-09-19 ENCOUNTER — Ambulatory Visit (INDEPENDENT_AMBULATORY_CARE_PROVIDER_SITE_OTHER): Payer: BLUE CROSS/BLUE SHIELD

## 2014-09-19 ENCOUNTER — Ambulatory Visit (INDEPENDENT_AMBULATORY_CARE_PROVIDER_SITE_OTHER): Payer: BLUE CROSS/BLUE SHIELD | Admitting: Family Medicine

## 2014-09-19 VITALS — BP 116/60 | HR 107 | Temp 97.6°F | Resp 22 | Ht 69.5 in | Wt 332.0 lb

## 2014-09-19 DIAGNOSIS — F328 Other depressive episodes: Secondary | ICD-10-CM

## 2014-09-19 DIAGNOSIS — S86811A Strain of other muscle(s) and tendon(s) at lower leg level, right leg, initial encounter: Secondary | ICD-10-CM | POA: Diagnosis not present

## 2014-09-19 DIAGNOSIS — M25561 Pain in right knee: Secondary | ICD-10-CM

## 2014-09-19 DIAGNOSIS — S86911A Strain of unspecified muscle(s) and tendon(s) at lower leg level, right leg, initial encounter: Secondary | ICD-10-CM

## 2014-09-19 DIAGNOSIS — E1169 Type 2 diabetes mellitus with other specified complication: Secondary | ICD-10-CM

## 2014-09-19 DIAGNOSIS — E119 Type 2 diabetes mellitus without complications: Secondary | ICD-10-CM

## 2014-09-19 DIAGNOSIS — F411 Generalized anxiety disorder: Secondary | ICD-10-CM

## 2014-09-19 DIAGNOSIS — I10 Essential (primary) hypertension: Secondary | ICD-10-CM

## 2014-09-19 DIAGNOSIS — E669 Obesity, unspecified: Secondary | ICD-10-CM | POA: Diagnosis not present

## 2014-09-19 DIAGNOSIS — F3289 Other specified depressive episodes: Secondary | ICD-10-CM | POA: Insufficient documentation

## 2014-09-19 LAB — COMPREHENSIVE METABOLIC PANEL
ALK PHOS: 74 U/L (ref 39–117)
ALT: 45 U/L (ref 0–53)
AST: 30 U/L (ref 0–37)
Albumin: 4.1 g/dL (ref 3.5–5.2)
BILIRUBIN TOTAL: 0.3 mg/dL (ref 0.2–1.2)
BUN: 11 mg/dL (ref 6–23)
CO2: 31 mEq/L (ref 19–32)
Calcium: 9 mg/dL (ref 8.4–10.5)
Chloride: 98 mEq/L (ref 96–112)
Creat: 0.99 mg/dL (ref 0.50–1.35)
GLUCOSE: 105 mg/dL — AB (ref 70–99)
Potassium: 4 mEq/L (ref 3.5–5.3)
SODIUM: 137 meq/L (ref 135–145)
TOTAL PROTEIN: 7.8 g/dL (ref 6.0–8.3)

## 2014-09-19 LAB — LDL CHOLESTEROL, DIRECT: LDL DIRECT: 101 mg/dL — AB

## 2014-09-19 LAB — HEMOGLOBIN A1C
HEMOGLOBIN A1C: 7.2 % — AB (ref <5.7)
MEAN PLASMA GLUCOSE: 160 mg/dL — AB (ref <117)

## 2014-09-19 MED ORDER — MELOXICAM 15 MG PO TABS: 15.0000 mg | ORAL_TABLET | Freq: Every day | ORAL | Status: DC

## 2014-09-19 MED ORDER — CITALOPRAM HYDROBROMIDE 20 MG PO TABS: 20.0000 mg | ORAL_TABLET | Freq: Every day | ORAL | Status: DC

## 2014-09-19 MED ORDER — CLONAZEPAM 1 MG PO TABS: 1.0000 mg | ORAL_TABLET | Freq: Every day | ORAL | Status: DC

## 2014-09-19 MED ORDER — LISINOPRIL-HYDROCHLOROTHIAZIDE 10-12.5 MG PO TABS: 1.0000 | ORAL_TABLET | Freq: Every day | ORAL | Status: DC
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
## 2014-09-19 NOTE — Patient Instructions (Signed)
Use meloxicam as needed for knee pain Wear the knee brace as well Let me know if your knee is not getting better or if you would like to see orthopedics  I will be in touch with your labs I have refilled your other chronic medications as well  Your weight is putting your health at risk.  Please work on losing weight.  You might want to consider having weight loss surgery.  The first step here is to sign up for the free bariatric surgery seminar through [facility]

## 2014-09-20 ENCOUNTER — Encounter: Payer: Self-pay | Admitting: Family Medicine

## 2014-09-20 MED ORDER — METFORMIN HCL 500 MG PO TABS: ORAL_TABLET | ORAL | Status: DC

## 2014-09-20 NOTE — Addendum Note (Signed)
Addended by: Abbe AmsterdamOPLAND, Dushawn Pusey C on: 09/20/2014 01:23 PM   Modules accepted: Orders

## 2015-04-15 ENCOUNTER — Other Ambulatory Visit: Payer: Self-pay | Admitting: Family Medicine

## 2015-04-22 ENCOUNTER — Other Ambulatory Visit: Payer: Self-pay

## 2015-04-22 DIAGNOSIS — F411 Generalized anxiety disorder: Secondary | ICD-10-CM

## 2015-04-22 MED ORDER — CLONAZEPAM 1 MG PO TABS: 1.0000 mg | ORAL_TABLET | Freq: Every day | ORAL | Status: DC

## 2015-04-22 NOTE — Telephone Encounter (Signed)
Did his rf but noted that he never came back after we diagnosed his diabetes back in May.  [facility] to come and see us within the next month

## 2015-04-22 NOTE — Telephone Encounter (Signed)
Pharm reqs RF of clonazepam. Pended. 

## 2015-10-08 ENCOUNTER — Other Ambulatory Visit: Payer: Self-pay | Admitting: Family Medicine

## 2016-03-03 ENCOUNTER — Other Ambulatory Visit: Payer: Self-pay | Admitting: Emergency Medicine

## 2016-03-03 ENCOUNTER — Other Ambulatory Visit: Payer: Self-pay | Admitting: Family Medicine

## 2016-03-03 MED ORDER — LISINOPRIL-HYDROCHLOROTHIAZIDE 10-12.5 MG PO TABS: 1.0000 | ORAL_TABLET | Freq: Every day | ORAL | 0 refills | Status: DC

## 2016-03-03 NOTE — Telephone Encounter (Signed)
Received refill request for lisinopril-hctz. Last office visit 09/19/14 and last refill 10/19/15. This is the 2nd refill request that has been sent and it has been over a year since last visit. Is it ok to refill? Please advise.

## 2016-05-04 DIAGNOSIS — R000 Tachycardia, unspecified: Secondary | ICD-10-CM

## 2016-05-04 HISTORY — DX: Tachycardia, unspecified: R00.0

## 2016-05-09 ENCOUNTER — Ambulatory Visit: Payer: BLUE CROSS/BLUE SHIELD

## 2016-05-15 ENCOUNTER — Other Ambulatory Visit: Payer: Self-pay | Admitting: Emergency Medicine

## 2016-05-15 ENCOUNTER — Other Ambulatory Visit: Payer: Self-pay | Admitting: Family Medicine

## 2016-06-05 ENCOUNTER — Other Ambulatory Visit: Payer: Self-pay | Admitting: Emergency Medicine

## 2016-06-05 ENCOUNTER — Other Ambulatory Visit: Payer: Self-pay | Admitting: Family Medicine

## 2016-06-05 MED ORDER — METFORMIN HCL 500 MG PO TABS: ORAL_TABLET | ORAL | 0 refills | Status: DC

## 2016-06-10 IMAGING — CR DG KNEE COMPLETE 4+V*R*
4 series · 4 of 4 positions shown · non-contrast
Comparison: None

CLINICAL DATA: RIGHT knee pain of unknown origin, initial encounter

EXAM:
RIGHT KNEE - COMPLETE 4+ VIEW

[AP]
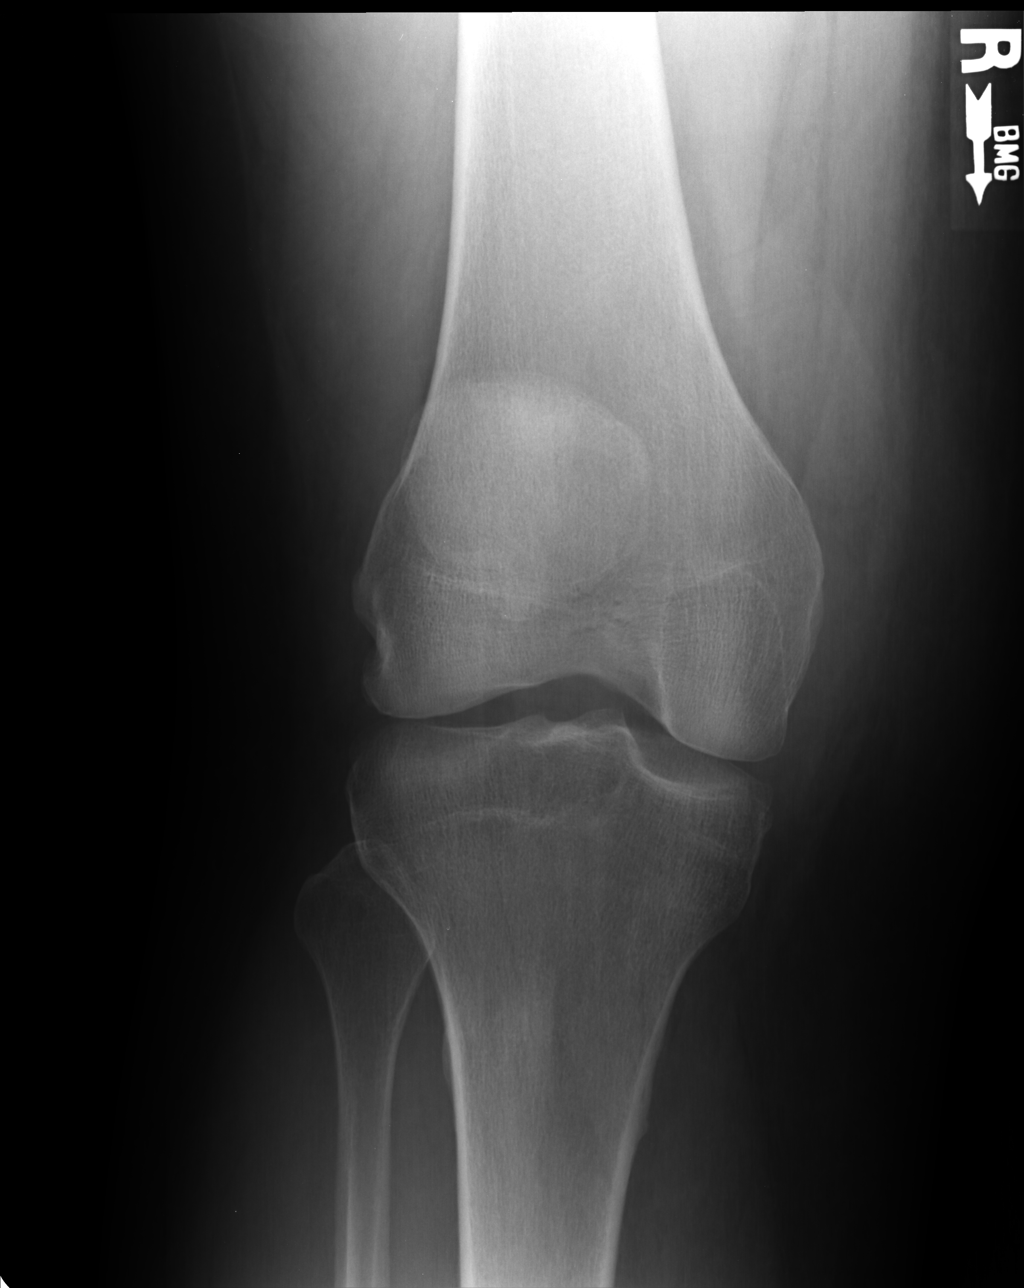

[ap axial]
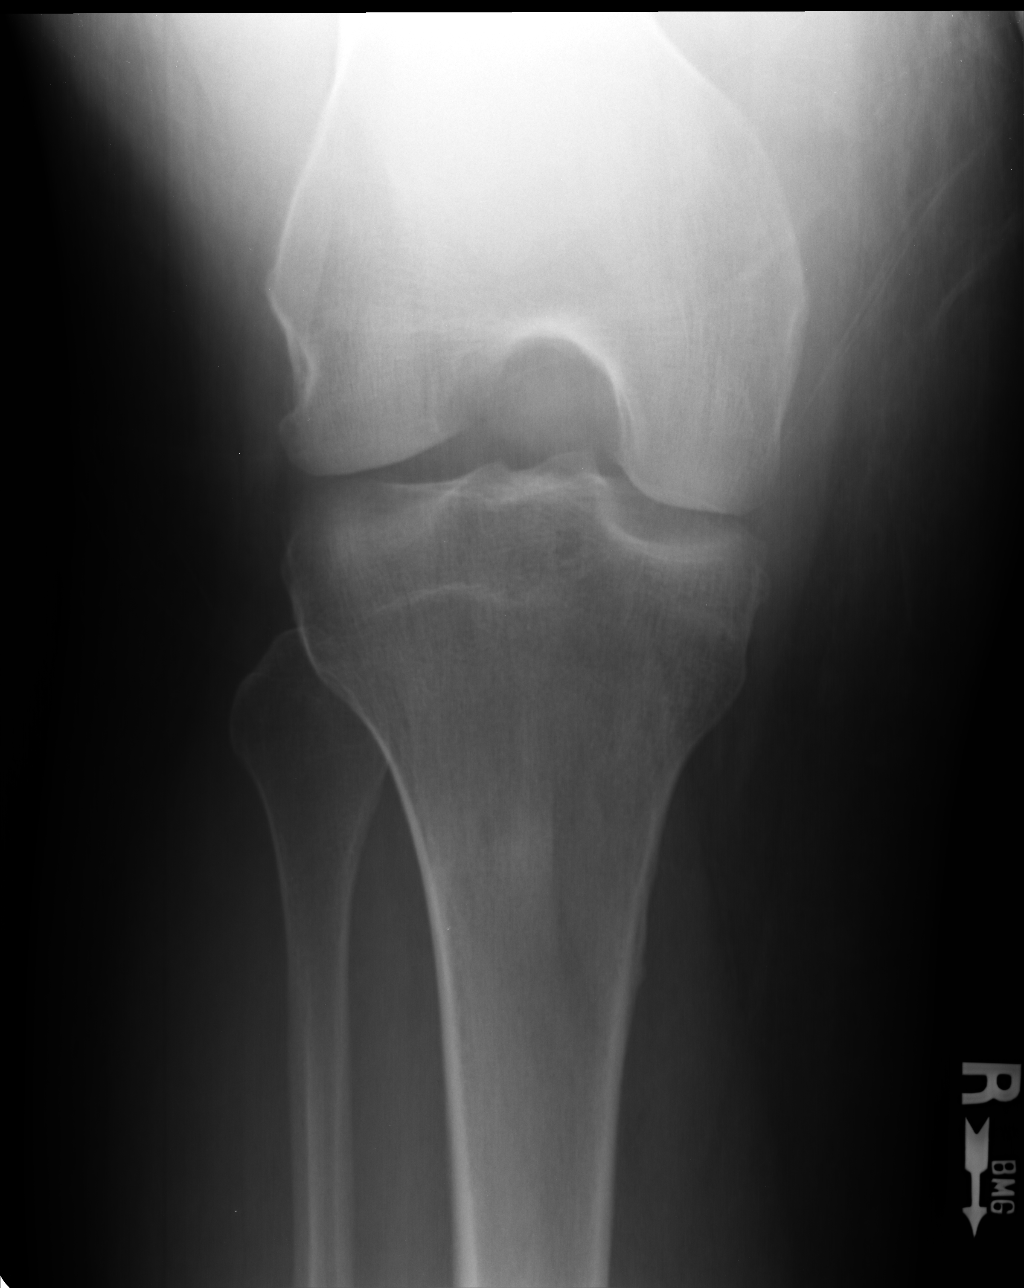

[lateral]
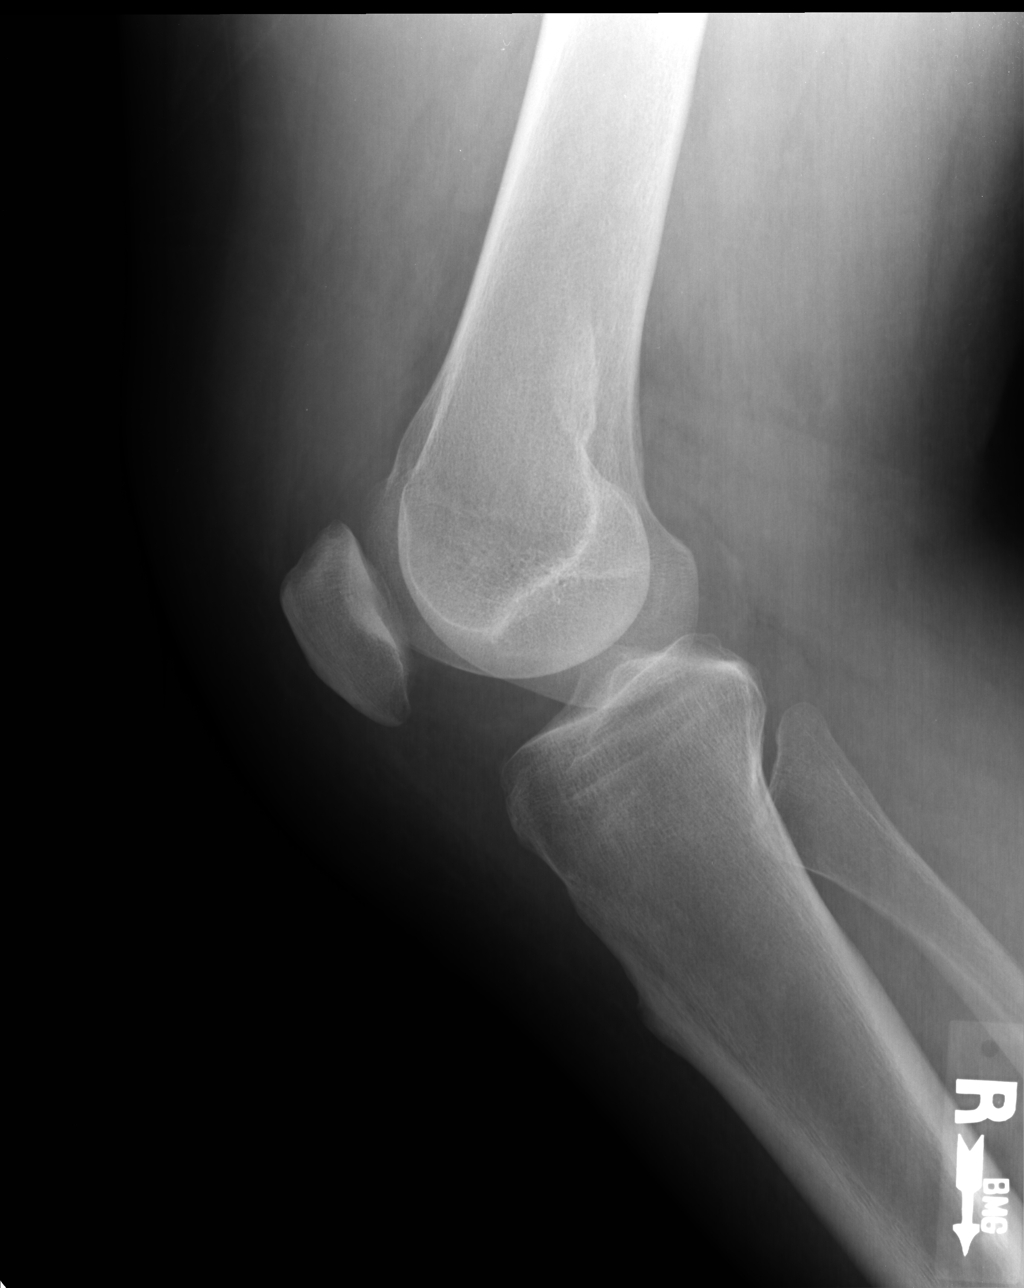

[sunrise]
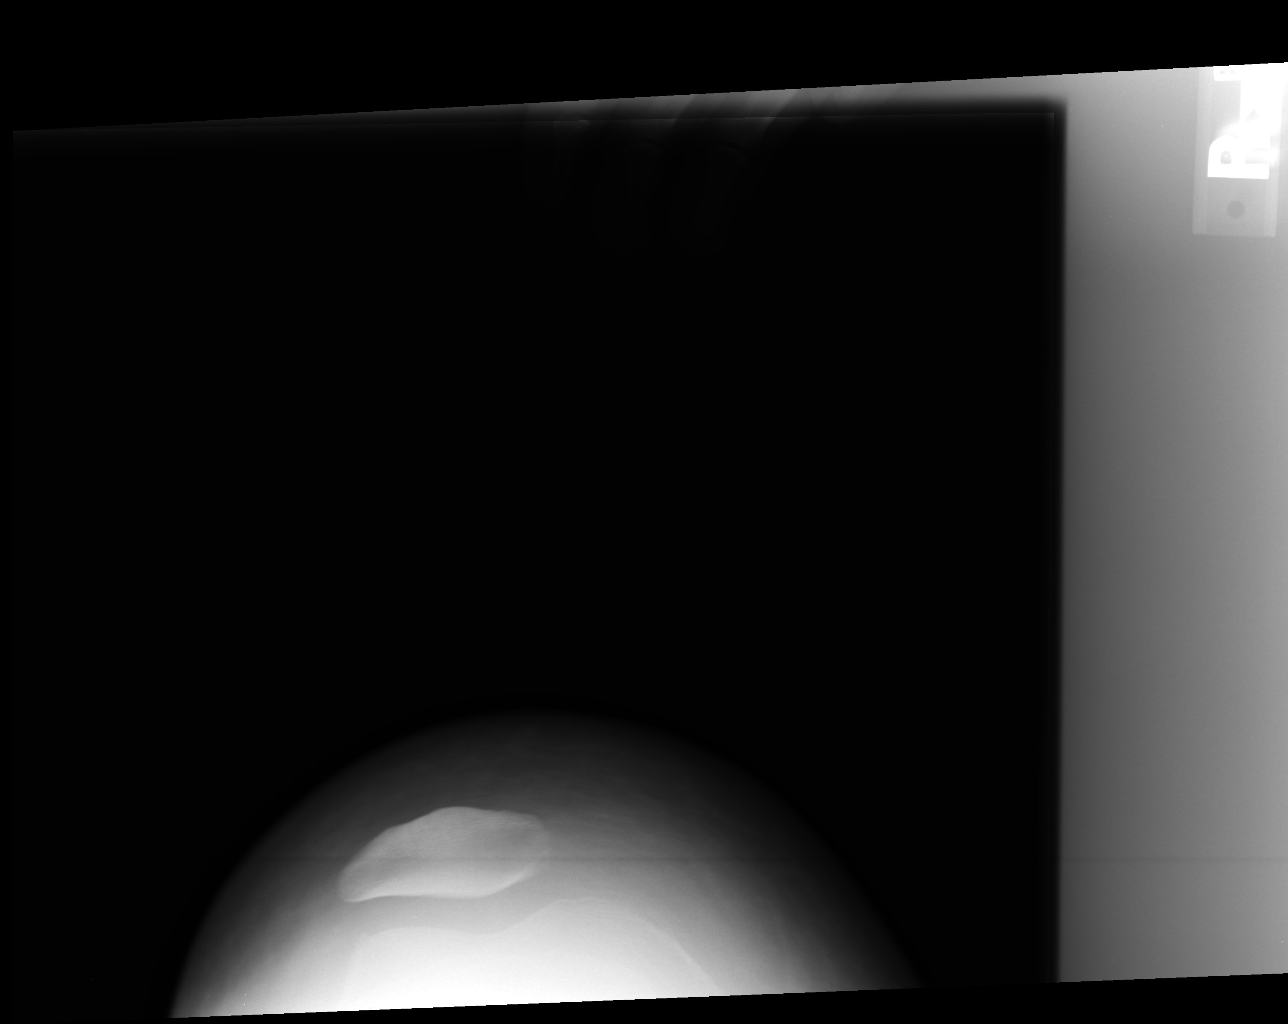

[4 of 4 positions shown; findings below may reference images not displayed]

FINDINGS: Overpenetrated exam.

Joint spaces grossly preserved.

No acute fracture, dislocation or bone destruction.

Questionable knee joint effusion.
IMPRESSION: Questionable RIGHT knee joint effusion.

No definite acute osseous abnormalities.

## 2016-10-06 ENCOUNTER — Other Ambulatory Visit: Payer: Self-pay | Admitting: Family Medicine

## 2016-10-08 ENCOUNTER — Ambulatory Visit: Payer: Self-pay | Admitting: Family Medicine

## 2016-10-13 ENCOUNTER — Other Ambulatory Visit: Payer: Self-pay | Admitting: Family Medicine

## 2016-10-20 ENCOUNTER — Encounter: Payer: Self-pay | Admitting: Family Medicine

## 2016-10-20 ENCOUNTER — Ambulatory Visit (INDEPENDENT_AMBULATORY_CARE_PROVIDER_SITE_OTHER): Payer: No Typology Code available for payment source | Admitting: Family Medicine

## 2016-10-20 VITALS — BP 122/87 | HR 107 | Temp 97.9°F | Resp 16 | Ht 69.0 in | Wt 287.5 lb

## 2016-10-20 DIAGNOSIS — G8929 Other chronic pain: Secondary | ICD-10-CM

## 2016-10-20 DIAGNOSIS — M79674 Pain in right toe(s): Secondary | ICD-10-CM

## 2016-10-20 DIAGNOSIS — E1142 Type 2 diabetes mellitus with diabetic polyneuropathy: Secondary | ICD-10-CM | POA: Diagnosis not present

## 2016-10-20 DIAGNOSIS — M79675 Pain in left toe(s): Secondary | ICD-10-CM

## 2016-10-20 DIAGNOSIS — E119 Type 2 diabetes mellitus without complications: Secondary | ICD-10-CM | POA: Diagnosis not present

## 2016-10-20 DIAGNOSIS — I10 Essential (primary) hypertension: Secondary | ICD-10-CM

## 2016-10-20 LAB — COMPREHENSIVE METABOLIC PANEL
ALBUMIN: 4.4 g/dL (ref 3.5–5.2)
ALK PHOS: 64 U/L (ref 39–117)
ALT: 44 U/L (ref 0–53)
AST: 33 U/L (ref 0–37)
BILIRUBIN TOTAL: 0.6 mg/dL (ref 0.2–1.2)
BUN: 9 mg/dL (ref 6–23)
CO2: 31 mEq/L (ref 19–32)
Calcium: 9.5 mg/dL (ref 8.4–10.5)
Chloride: 95 mEq/L — ABNORMAL LOW (ref 96–112)
Creatinine, Ser: 0.79 mg/dL (ref 0.40–1.50)
GFR: 120.73 mL/min (ref >60.00)
GLUCOSE: 233 mg/dL — AB (ref 70–99)
Potassium: 4.1 mEq/L (ref 3.5–5.1)
SODIUM: 135 meq/L (ref 135–145)
TOTAL PROTEIN: 8 g/dL (ref 6.0–8.3)

## 2016-10-20 LAB — HEMOGLOBIN A1C: HEMOGLOBIN A1C: 12.7 % — AB (ref 4.6–6.5)

## 2016-10-20 MED ORDER — LISINOPRIL-HYDROCHLOROTHIAZIDE 10-12.5 MG PO TABS: ORAL_TABLET | ORAL | 6 refills | Status: DC

## 2016-10-20 MED ORDER — GABAPENTIN 100 MG PO CAPS: ORAL_CAPSULE | ORAL | 0 refills | Status: DC

## 2016-10-20 NOTE — Progress Notes (Signed)
Office Note 10/20/2016  CC:  Chief Complaint  Patient presents with  . Establish Care    no previous PCP, went to [facility] on [address], [place]  . Foot Pain    bilateral x 6 months  . Follow-up    HTN and DM   HPI:  Eric Hardy is a 31 y.o.  male who is here to establish care Patient's most recent primary MD: none (went intermittently to [facility] in [place]). Old records in EPIC/[facility] EMR were reviewed prior to or during today's visit.  Most recent visit with [facility] was 09/2014 according to EMR.  Pt states they just kept giving RF's of his metformin and his bp med.    HTN; no home monitoring.  Occ check at pharmacy or [facility]--normal. DM 2: no home monitoring.  No diabetic diet.  Both feet feel numb/tingling in toes (all), worse towards end of day.  Same sitting as it is walking.  No hx of ulcer. No pain in bottoms of feet or heels.  Burning pain towards end of day as well.  Tried changing shoes to see if this helps. No color change in feet is noted.  Past Medical History:  Diagnosis Date  . Chronic low back pain    Facet syndrome per Dr. Wynn BankerKirsteins (2015)--referred to pain mgmt by [facility] 11/2012 and 05/2013.  . Diabetes mellitus (HCC) 2016   HbA1c 7.2%  . GAD (generalized anxiety disorder)   . Hypertension   . Morbid obesity (HCC)    ? pickwickian syndrome  . OSA on CPAP     History reviewed. No pertinent surgical history.  Family History  Problem Relation Age of Onset  . Hypertension Mother   . Hypertension Maternal Grandmother   . Heart attack Maternal Grandfather   . Hypertension Paternal Grandmother   . Arthritis Paternal Grandmother   . Hyperlipidemia Paternal Grandmother   . Hypertension Paternal Grandfather     Social History   Social History  . Marital status: Divorced    Spouse name: N/A  . Number of children: N/A  . Years of education: N/A   Occupational History  . Not on file.   Social History Main Topics  . Smoking status: Never Smoker  .  Smokeless tobacco: Never Used  . Alcohol use Yes     Comment: socially  . Drug use: No  . Sexual activity: Yes   Other Topics Concern  . Not on file   Social History Narrative   Divorced, 2 children.   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   Orig from [place].   No tob/alc/drugs.    Outpatient Encounter Prescriptions as of 10/20/2016  Medication Sig  . lisinopril-hydrochlorothiazide (PRINZIDE,ZESTORETIC) 10-12.5 MG tablet TAKE ONE TABLET BY MOUTH ONCE DAILY.  . metFORMIN (GLUCOPHAGE) 500 MG tablet TAKE ONE TABLET BY MOUTH TWICE DAILY  . [DISCONTINUED] lisinopril-hydrochlorothiazide (PRINZIDE,ZESTORETIC) 10-12.5 MG tablet TAKE ONE TABLET BY MOUTH ONCE DAILY. MUST HAVE OFFICE VISIT BEFORE FURTHER REFILLS.  Marland Kitchen. gabapentin (NEURONTIN) 100 MG capsule 1 tab po tid x 7d, then increase to 2 tabs po tid  . [DISCONTINUED] citalopram (CELEXA) 20 MG tablet TAKE ONE TABLET BY MOUTH ONCE DAILY (Patient not taking: Reported on 10/20/2016)  . [DISCONTINUED] clonazePAM (KLONOPIN) 1 MG tablet Take 1 tablet (1 mg total) by mouth daily. Use as needed for anxiety (Patient not taking: Reported on 10/20/2016)  . [DISCONTINUED] meloxicam (MOBIC) 15 MG tablet Take 1 tablet (15 mg total) by mouth daily. (  Patient not taking: Reported on 10/20/2016)   No facility-administered encounter medications on file as of 10/20/2016.     No Known Allergies  ROS Review of Systems  Constitutional: Negative for fatigue and fever.  HENT: Negative for congestion and sore throat.   Eyes: Negative for visual disturbance.  Respiratory: Negative for cough.   Cardiovascular: Negative for chest pain.  Gastrointestinal: Negative for abdominal pain and nausea.  Genitourinary: Negative for dysuria.  Musculoskeletal: Negative for back pain and joint swelling.  Skin: Negative for rash.  Neurological: Negative for weakness and headaches.  Hematological: Negative for adenopathy.    Blood pressure 122/87, pulse (!) 107,  temperature 97.9 F (36.6 C), temperature source Oral, resp. rate 16, height 5\' 9"  (1.753 m), weight 287 lb 8 oz (130.4 kg), SpO2 97 %. Body mass index is 42.46 kg/m.  Gen: Alert, well appearing.  Patient is oriented to person, place, time, and situation. AFFECT: pleasant, lucid thought and speech. GEX:BMWU: no injection, icteris, swelling, or exudate.  EOMI, PERRLA. Mouth: lips without lesion/swelling.  Oral mucosa pink and moist. Oropharynx without erythema or exudate. His uvula is moderate sized and his tonsils are 2-3+ size and symmetric. CV: RRR, no m/r/g.   LUNGS: CTA bilat, nonlabored resps, good aeration in all lung fields. EXT: no clubbing, cyanosis, or edema.  Foot exam - bilateral normal; no swelling, tenderness or skin or vascular lesions. Color and temperature is normal. Sensation is intact. Peripheral pulses are palpable. Toenails are normal.   Pertinent labs:  Lab Results  Component Value Date   TSH 1.476 08/23/2012   Lab Results  Component Value Date   WBC 11.7 (A) 08/23/2012   HGB 15.5 08/23/2012   HCT 46.6 08/23/2012   MCV 87.9 08/23/2012   PLT 234 05/20/2012   Lab Results  Component Value Date   CREATININE 0.99 09/19/2014   BUN 11 09/19/2014   NA 137 09/19/2014   K 4.0 09/19/2014   CL 98 09/19/2014   CO2 31 09/19/2014   Lab Results  Component Value Date   ALT 45 09/19/2014   AST 30 09/19/2014   ALKPHOS 74 09/19/2014   BILITOT 0.3 09/19/2014   Lab Results  Component Value Date   CHOL 191 11/11/2012   Lab Results  Component Value Date   HDL 29 (L) 11/11/2012   Lab Results  Component Value Date   LDLCALC 111 (H) 11/11/2012   Lab Results  Component Value Date   TRIG 256 (H) 11/11/2012   Lab Results  Component Value Date   CHOLHDL 6.6 11/11/2012   Lab Results  Component Value Date   HGBA1C 7.2 (H) 09/19/2014    ASSESSMENT AND PLAN:   New pt; no old records to obtain.  1) DM 2;  Needs nutritionist referral --will discuss/refer at  next f/u in 1 mo. Check hba1c today. Feet exam normal but he has sx's of DPN. Needs eye exam--will address next f/u visit.  2) HTN: The current medical regimen is effective;  continue present plan and medications. Lytes/cr today. RF'd bp med today.  3) Bilateral feet/toes pain and paresthesias: c/w DPN. Start gabapentin 100 mg tid x7d, then increase to 200 mg tid and continue this dosing until I see him again in 4 weeks.  An After Visit Summary was printed and given to the patient.  Return in about 4 weeks (around 11/17/2016) for annual CPE (fasting).  Signed:  Santiago Bumpers, MD           10/20/2016

## 2016-10-27 ENCOUNTER — Encounter: Payer: Self-pay | Admitting: *Deleted

## 2016-11-03 ENCOUNTER — Other Ambulatory Visit: Payer: Self-pay

## 2016-11-03 ENCOUNTER — Telehealth: Payer: Self-pay

## 2016-11-03 MED ORDER — GLIPIZIDE ER 5 MG PO TB24: 5.0000 mg | ORAL_TABLET | Freq: Every day | ORAL | 1 refills | Status: DC

## 2016-11-03 MED ORDER — METFORMIN HCL 1000 MG PO TABS: 1000.0000 mg | ORAL_TABLET | Freq: Two times a day (BID) | ORAL | 1 refills | Status: DC

## 2016-11-03 NOTE — Telephone Encounter (Signed)
-----   Message from Jeoffrey MassedPhilip H McGowen, MD sent at 10/21/2016  8:47 AM EDT ----- All labs normal EXCEPT glucose was 233 and HbA1c was 12.7% (up from 7.2% 2 yrs ago).  Good diabetic control is HbA1c of <7%. I recommend:  Nutritionist referral.  Also, increase metformin to 1000 mg twice per day, and add an additional diabetes pill called glipizide. Also, pls help pt get a glucometer and strips, have him check a fasting sugar every day and write numbers down so we can review these at next f/u in 4 wks. If pt agreeable to med changes, pls eRx metformin 1000 mg, 1 tab po bid, #60, RF x 1.  Also glipizide XL 5mg , 1 tab po qAM, #30, RF x 1.  Also, nutrition consult with [facility], initial/3 hr, dx DM 2 without complications. --thx

## 2016-12-02 ENCOUNTER — Ambulatory Visit: Payer: No Typology Code available for payment source | Admitting: Family Medicine

## 2016-12-07 ENCOUNTER — Ambulatory Visit (INDEPENDENT_AMBULATORY_CARE_PROVIDER_SITE_OTHER): Payer: No Typology Code available for payment source | Admitting: Family Medicine

## 2016-12-07 ENCOUNTER — Encounter: Payer: Self-pay | Admitting: Family Medicine

## 2016-12-07 VITALS — BP 120/80 | HR 120 | Temp 97.0°F | Resp 16 | Ht 69.0 in | Wt 294.8 lb

## 2016-12-07 DIAGNOSIS — E119 Type 2 diabetes mellitus without complications: Secondary | ICD-10-CM

## 2016-12-07 DIAGNOSIS — I10 Essential (primary) hypertension: Secondary | ICD-10-CM

## 2016-12-07 DIAGNOSIS — R000 Tachycardia, unspecified: Secondary | ICD-10-CM | POA: Diagnosis not present

## 2016-12-07 MED ORDER — GLIPIZIDE ER 5 MG PO TB24
5.0000 mg | ORAL_TABLET | Freq: Every day | ORAL | 1 refills | Status: DC
Start: 2016-12-07 — End: 2017-03-23

## 2016-12-07 MED ORDER — BLOOD GLUCOSE METER KIT: PACK | 0 refills | Status: DC

## 2016-12-07 MED ORDER — METFORMIN HCL 1000 MG PO TABS: 1000.0000 mg | ORAL_TABLET | Freq: Two times a day (BID) | ORAL | 6 refills | Status: DC

## 2016-12-07 NOTE — Progress Notes (Signed)
OFFICE VISIT  12/07/2016   CC:  Chief Complaint  Patient presents with  . Follow-up    DM   HPI:    Patient is a 31 y.o. male who presents for f/u DM 2, HTN, and morbid obesity. Added low dose glipizide last visit due to high A1c, also recommended he see nutritionist but it appears this order never got placed.  DM 2: doing fine on new med.  NO home glucose monitoring--needs supplies.   Also has had metformin increased to 1000 mg bid.  Denies side effects from either med.   Denies polyuria or polydipsia. Exercise: working 12h a day but no formal exercise.  HTN: occ home bp monitoring is 120s/80s.  He knows he needs to lose wt but is currently not motivated to change diet on his own (nutritionist referral) and not motivated to exercise beyond what he does for his job.  ROS: denies palpitations, heat intolerance, dizziness, or diarrhea.  Past Medical History:  Diagnosis Date  . Chronic low back pain    Facet syndrome per Dr. Wynn Banker (2015)--referred to pain mgmt by [facility] 11/2012 and 05/2013.  . Diabetes mellitus (HCC) 2016   HbA1c 7.2%  . GAD (generalized anxiety disorder)   . Hypertension   . Morbid obesity (HCC)    ? pickwickian syndrome  . OSA on CPAP     History reviewed. No pertinent surgical history.  Outpatient Medications Prior to Visit  Medication Sig Dispense Refill  . gabapentin (NEURONTIN) 100 MG capsule 1 tab po tid x 7d, then increase to 2 tabs po tid 219 capsule 0  . lisinopril-hydrochlorothiazide (PRINZIDE,ZESTORETIC) 10-12.5 MG tablet TAKE ONE TABLET BY MOUTH ONCE DAILY. 30 tablet 6  . glipiZIDE (GLIPIZIDE XL) 5 MG 24 hr tablet Take 1 tablet (5 mg total) by mouth daily with breakfast. 30 tablet 1  . metFORMIN (GLUCOPHAGE) 1000 MG tablet Take 1 tablet (1,000 mg total) by mouth 2 (two) times daily with a meal. 60 tablet 1   No facility-administered medications prior to visit.     No Known Allergies  ROS As per HPI  PE: Blood pressure 120/80,  pulse (!) 120, temperature (!) 97 F (36.1 C), temperature source Oral, resp. rate 16, height 5\' 9"  (1.753 m), weight 294 lb 12 oz (133.7 kg), SpO2 97 %. Gen: Alert, well appearing.  Patient is oriented to person, place, time, and situation. AFFECT: pleasant, lucid thought and speech. Neck - No masses or thyromegaly or limitation in range of motion CV: tachy to 110, no m/r/g.  Rhythm is regular. Chest is clear, no wheezing or rales. Normal symmetric air entry throughout both lung fields. No chest wall deformities or tenderness.  LABS:  Lab Results  Component Value Date   TSH 1.476 08/23/2012   Lab Results  Component Value Date   WBC 11.7 (A) 08/23/2012   HGB 15.5 08/23/2012   HCT 46.6 08/23/2012   MCV 87.9 08/23/2012   PLT 234 05/20/2012   Lab Results  Component Value Date   CREATININE 0.79 10/20/2016   BUN 9 10/20/2016   NA 135 10/20/2016   K 4.1 10/20/2016   CL 95 (L) 10/20/2016   CO2 31 10/20/2016   Lab Results  Component Value Date   ALT 44 10/20/2016   AST 33 10/20/2016   ALKPHOS 64 10/20/2016   BILITOT 0.6 10/20/2016   Lab Results  Component Value Date   CHOL 191 11/11/2012   Lab Results  Component Value Date   HDL 29 (  L) 11/11/2012   Lab Results  Component Value Date   LDLCALC 111 (H) 11/11/2012   Lab Results  Component Value Date   TRIG 256 (H) 11/11/2012   Lab Results  Component Value Date   CHOLHDL 6.6 11/11/2012   Lab Results  Component Value Date   HGBA1C 12.7 (H) 10/20/2016   IMPRESSION AND PLAN:  1) DM 2, poor control Tolerating increase in metformin and addition of glipizide. Referral to nutritionist today. Glucometer with supplies rx'd today, check glucose at least fasting qd and write them down for review with me at next f/u visit in 6 wks (fasting--will check FLP at that visit as well as TSH and A1c).  2) HTN: The current medical regimen is effective;  continue present plan and medications. Lytes/cr good 10/20/16.  3) Mild  resting tachycardia: pt asymptomatic, thyroid exam normal. Will check TSH at next f/u visit in 6 wks.  An After Visit Summary was printed and given to the patient.  FOLLOW UP: Return in about 6 weeks (around 01/18/2017) for routine chronic illness f/u (FASTING).  Signed:  Santiago BumpersPhil McGowen, MD           12/07/2016

## 2017-01-15 ENCOUNTER — Ambulatory Visit: Payer: No Typology Code available for payment source | Admitting: Family Medicine

## 2017-01-15 NOTE — Progress Notes (Deleted)
OFFICE VISIT  01/15/2017   CC: No chief complaint on file.  HPI:    Patient is a 31 y.o.  male who presents for 6 week f/u DM 2, HTN, morbid obesity. Nutritionist referral ordered last visit. Ordered him glucose monitoring supplies as well.  Past Medical History:  Diagnosis Date  . Chronic low back pain    Facet syndrome per Dr. Letta Pate (2015)--referred to pain mgmt by [facility] 11/2012 and 05/2013.  . Diabetes mellitus ([place]) 2016   HbA1c 7.2%  . GAD (generalized anxiety disorder)   . Hypertension   . Morbid obesity ([place])    ? pickwickian syndrome  . OSA on CPAP   . Sinus tachycardia 2018    No past surgical history on file.  Outpatient Medications Prior to Visit  Medication Sig Dispense Refill  . blood glucose meter kit and supplies Use to check blood sugar once daily. 1 each 0  . gabapentin (NEURONTIN) 100 MG capsule 1 tab po tid x 7d, then increase to 2 tabs po tid 219 capsule 0  . glipiZIDE (GLIPIZIDE XL) 5 MG 24 hr tablet Take 1 tablet (5 mg total) by mouth daily with breakfast. 30 tablet 1  . lisinopril-hydrochlorothiazide (PRINZIDE,ZESTORETIC) 10-12.5 MG tablet TAKE ONE TABLET BY MOUTH ONCE DAILY. 30 tablet 6  . metFORMIN (GLUCOPHAGE) 1000 MG tablet Take 1 tablet (1,000 mg total) by mouth 2 (two) times daily with a meal. 60 tablet 6   No facility-administered medications prior to visit.     No Known Allergies  ROS As per HPI  PE: There were no vitals taken for this visit. ***  LABS:  Lab Results  Component Value Date   TSH 1.476 08/23/2012   Lab Results  Component Value Date   WBC 11.7 (A) 08/23/2012   HGB 15.5 08/23/2012   HCT 46.6 08/23/2012   MCV 87.9 08/23/2012   PLT 234 05/20/2012   Lab Results  Component Value Date   CREATININE 0.79 10/20/2016   BUN 9 10/20/2016   NA 135 10/20/2016   K 4.1 10/20/2016   CL 95 (L) 10/20/2016   CO2 31 10/20/2016   Lab Results  Component Value Date   ALT 44 10/20/2016   AST 33 10/20/2016   ALKPHOS 64  10/20/2016   BILITOT 0.6 10/20/2016   Lab Results  Component Value Date   CHOL 191 11/11/2012   Lab Results  Component Value Date   HDL 29 (L) 11/11/2012   Lab Results  Component Value Date   LDLCALC 111 (H) 11/11/2012   Lab Results  Component Value Date   TRIG 256 (H) 11/11/2012   Lab Results  Component Value Date   CHOLHDL 6.6 11/11/2012   Lab Results  Component Value Date   HGBA1C 12.7 (H) 10/20/2016   IMPRESSION AND PLAN:  No problem-specific Assessment & Plan notes found for this encounter.  Labs: A1c, TSH, FLP. Pneumovax, Tdap, Flu  An After Visit Summary was printed and given to the patient.  FOLLOW UP: No Follow-up on file.needs CPE  Signed:  Crissie Sickles, MD           01/15/2017

## 2017-02-18 ENCOUNTER — Other Ambulatory Visit: Payer: Self-pay | Admitting: Family Medicine

## 2017-02-18 NOTE — Telephone Encounter (Signed)
I'll rx 1 mo supply with 1 RF. Pt is due for f/u DM/HTN--have him make appt for sometime in the next 1 mo.-thx

## 2017-02-18 NOTE — Telephone Encounter (Signed)
[facility] [address]  RF request for gabapentin LOV: 12/07/16 Next ov: None Last written: 10/20/16 #219 w/ 0RF  Please advise. Thanks.

## 2017-02-19 NOTE — Telephone Encounter (Signed)
Patient aware.  Appt scheduled 11.2.18 @ 8:45am.

## 2017-03-05 ENCOUNTER — Ambulatory Visit: Payer: No Typology Code available for payment source | Admitting: Family Medicine

## 2017-03-19 ENCOUNTER — Encounter: Payer: Self-pay | Admitting: Family Medicine

## 2017-03-19 ENCOUNTER — Other Ambulatory Visit: Payer: Self-pay

## 2017-03-19 ENCOUNTER — Ambulatory Visit: Payer: No Typology Code available for payment source | Admitting: Family Medicine

## 2017-03-19 VITALS — BP 112/71 | HR 95 | Temp 98.0°F | Resp 16 | Ht 69.0 in | Wt 299.5 lb

## 2017-03-19 DIAGNOSIS — E7800 Pure hypercholesterolemia, unspecified: Secondary | ICD-10-CM | POA: Diagnosis not present

## 2017-03-19 DIAGNOSIS — E119 Type 2 diabetes mellitus without complications: Secondary | ICD-10-CM | POA: Diagnosis not present

## 2017-03-19 DIAGNOSIS — I10 Essential (primary) hypertension: Secondary | ICD-10-CM

## 2017-03-19 LAB — LIPID PANEL
CHOLESTEROL: 224 mg/dL — AB (ref 0–200)
HDL: 33.3 mg/dL — AB (ref >39.00)
LDL Cholesterol: 162 mg/dL — ABNORMAL HIGH (ref 0–99)
NonHDL: 190.79
Total CHOL/HDL Ratio: 7
Triglycerides: 146 mg/dL (ref 0.0–149.0)
VLDL: 29.2 mg/dL (ref 0.0–40.0)

## 2017-03-19 LAB — BASIC METABOLIC PANEL
BUN: 10 mg/dL (ref 6–23)
CALCIUM: 9.8 mg/dL (ref 8.4–10.5)
CO2: 32 meq/L (ref 19–32)
Chloride: 95 mEq/L — ABNORMAL LOW (ref 96–112)
Creatinine, Ser: 0.99 mg/dL (ref 0.40–1.50)
GFR: 92.81 mL/min (ref >60.00)
GLUCOSE: 110 mg/dL — AB (ref 70–99)
Potassium: 4.7 mEq/L (ref 3.5–5.1)
SODIUM: 135 meq/L (ref 135–145)

## 2017-03-19 LAB — HEMOGLOBIN A1C: HEMOGLOBIN A1C: 8.1 % — AB (ref 4.6–6.5)

## 2017-03-19 LAB — MICROALBUMIN / CREATININE URINE RATIO
Creatinine,U: 159.7 mg/dL
MICROALB/CREAT RATIO: 0.8 mg/g (ref 0.0–30.0)
Microalb, Ur: 1.2 mg/dL (ref 0.0–1.9)

## 2017-03-19 MED ORDER — ATORVASTATIN CALCIUM 20 MG PO TABS: 20.0000 mg | ORAL_TABLET | Freq: Every day | ORAL | 1 refills | Status: DC

## 2017-03-19 NOTE — Progress Notes (Signed)
OFFICE VISIT  03/19/2017   CC:  Chief Complaint  Patient presents with  . Follow-up    RCI, pt is fasting.     HPI:    Patient is a 31 y.o. Caucasian male who presents for f/u DM 2, HTN, morbid obesity.  DM 2: he never got monitor after last visit so no glucose checks to report. Did not go to nutritionist.  Says he has been out of town working since I last saw him.  HTN: occ home bp monitoring--all normal.  Diet: not eating diabetic diet. No exercise at this time.  Past Medical History:  Diagnosis Date  . Chronic low back pain    Facet syndrome per Dr. Letta Pate (2015)--referred to pain mgmt by [facility] 11/2012 and 05/2013.  . Diabetes mellitus ([place]) 2016   HbA1c 7.2%  . GAD (generalized anxiety disorder)   . Hypertension   . Morbid obesity ([place])    ? pickwickian syndrome  . OSA on CPAP   . Sinus tachycardia 2018    History reviewed. No pertinent surgical history.  Outpatient Medications Prior to Visit  Medication Sig Dispense Refill  . FREESTYLE LITE test strip 1 each daily by Other route.    Marland Kitchen glipiZIDE (GLIPIZIDE XL) 5 MG 24 hr tablet Take 1 tablet (5 mg total) by mouth daily with breakfast. 30 tablet 1  . Lancets (FREESTYLE) lancets 1 each daily by Other route.    Marland Kitchen lisinopril-hydrochlorothiazide (PRINZIDE,ZESTORETIC) 10-12.5 MG tablet TAKE ONE TABLET BY MOUTH ONCE DAILY. 30 tablet 6  . metFORMIN (GLUCOPHAGE) 1000 MG tablet Take 1 tablet (1,000 mg total) by mouth 2 (two) times daily with a meal. 60 tablet 6  . gabapentin (NEURONTIN) 100 MG capsule 2 caps po tid (Patient not taking: Reported on 03/19/2017) 180 capsule 1  . blood glucose meter kit and supplies Use to check blood sugar once daily. (Patient not taking: Reported on 03/19/2017) 1 each 0   No facility-administered medications prior to visit.     No Known Allergies  ROS As per HPI  PE: Blood pressure 112/71, pulse 95, temperature 98 F (36.7 C), temperature source Oral, resp. rate 16, height 5'  9" (1.753 m), weight 299 lb 8 oz (135.9 kg), SpO2 97 %. Body mass index is 44.23 kg/m.  Gen: Alert, well appearing, morbidly obese-appearing.  Patient is oriented to person, place, time, and situation. AFFECT: pleasant, lucid thought and speech. CV: RRR, no m/r/g.   LUNGS: CTA bilat, nonlabored resps, good aeration in all lung fields. EXT: no clubbing, cyanosis, or edema.    LABS:    Chemistry      Component Value Date/Time   NA 135 10/20/2016 0913   K 4.1 10/20/2016 0913   CL 95 (L) 10/20/2016 0913   CO2 31 10/20/2016 0913   BUN 9 10/20/2016 0913   CREATININE 0.79 10/20/2016 0913   CREATININE 0.99 09/19/2014 1533      Component Value Date/Time   CALCIUM 9.5 10/20/2016 0913   ALKPHOS 64 10/20/2016 0913   AST 33 10/20/2016 0913   ALT 44 10/20/2016 0913   BILITOT 0.6 10/20/2016 0913     Lab Results  Component Value Date   HGBA1C 12.7 (H) 10/20/2016   Lab Results  Component Value Date   CHOL 191 11/11/2012   HDL 29 (L) 11/11/2012   LDLCALC 111 (H) 11/11/2012   LDLDIRECT 101 (H) 09/19/2014   TRIG 256 (H) 11/11/2012   CHOLHDL 6.6 11/11/2012    IMPRESSION AND PLAN:  1) DM 2, poor control. Dietary info printed out for pt today, still encourage to set up appt with nutritionist. Urine microalbumin/cr today. HbA1c today. Started atorva 19m qd.  2) HTN: The current medical regimen is effective;  continue present plan and medications. Lytes/cr today.  3) Morbid obesity: emphasized need to begin exercising at least 15 min per day--work up to 30 min per day of at least light aerobic exercise.  Dietary info given today to pt.  4) Hyperlipidemia: started atorva 233mqd today. FLP today.  An After Visit Summary was printed and given to the patient.  FOLLOW UP: Return in about 3 months (around 06/19/2017) for routine chronic illness f/u.  Signed:  PhCrissie SicklesMD           03/19/2017

## 2017-03-19 NOTE — Patient Instructions (Signed)
Diabetes Mellitus and Food It is important for you to manage your blood sugar (glucose) level. Your blood glucose level can be greatly affected by what you eat. Eating healthier foods in the appropriate amounts throughout the day at about the same time each day will help you control your blood glucose level. It can also help slow or prevent worsening of your diabetes mellitus. Healthy eating may even help you improve the level of your blood pressure and reach or maintain a healthy weight. General recommendations for healthful eating and cooking habits include:  Eating meals and snacks regularly. Avoid going long periods of time without eating to lose weight.  Eating a diet that consists mainly of plant-based foods, such as fruits, vegetables, nuts, legumes, and whole grains.  Using low-heat cooking methods, such as baking, instead of high-heat cooking methods, such as deep frying.  Work with your dietitian to make sure you understand how to use the Nutrition Facts information on food labels. How can food affect me? Carbohydrates Carbohydrates affect your blood glucose level more than any other type of food. Your dietitian will help you determine how many carbohydrates to eat at each meal and teach you how to count carbohydrates. Counting carbohydrates is important to keep your blood glucose at a healthy level, especially if you are using insulin or taking certain medicines for diabetes mellitus. Alcohol Alcohol can cause sudden decreases in blood glucose (hypoglycemia), especially if you use insulin or take certain medicines for diabetes mellitus. Hypoglycemia can be a life-threatening condition. Symptoms of hypoglycemia (sleepiness, dizziness, and disorientation) are similar to symptoms of having too much alcohol. If your health care provider has given you approval to drink alcohol, do so in moderation and use the following guidelines:  Women should not have more than one drink per day, and men  should not have more than two drinks per day. One drink is equal to: ? 12 oz of beer. ? 5 oz of wine. ? 1 oz of hard liquor.  Do not drink on an empty stomach.  Keep yourself hydrated. Have water, diet soda, or unsweetened iced tea.  Regular soda, juice, and other mixers might contain a lot of carbohydrates and should be counted.  What foods are not recommended? As you make food choices, it is important to remember that all foods are not the same. Some foods have fewer nutrients per serving than other foods, even though they might have the same number of calories or carbohydrates. It is difficult to get your body what it needs when you eat foods with fewer nutrients. Examples of foods that you should avoid that are high in calories and carbohydrates but low in nutrients include:  Trans fats (most processed foods list trans fats on the Nutrition Facts label).  Regular soda.  Juice.  Candy.  Sweets, such as cake, pie, doughnuts, and cookies.  Fried foods.  What foods can I eat? Eat nutrient-rich foods, which will nourish your body and keep you healthy. The food you should eat also will depend on several factors, including:  The calories you need.  The medicines you take.  Your weight.  Your blood glucose level.  Your blood pressure level.  Your cholesterol level.  You should eat a variety of foods, including:  Protein. ? Lean cuts of meat. ? Proteins low in saturated fats, such as fish, egg whites, and beans. Avoid processed meats.  Fruits and vegetables. ? Fruits and vegetables that may help control blood glucose levels, such as apples,   mangoes, and yams.  Dairy products. ? Choose fat-free or low-fat dairy products, such as milk, yogurt, and cheese.  Grains, bread, pasta, and rice. ? Choose whole grain products, such as multigrain bread, whole oats, and brown rice. These foods may help control blood pressure.  Fats. ? Foods containing healthful fats, such as  nuts, avocado, olive oil, canola oil, and fish.  Does everyone with diabetes mellitus have the same meal plan? Because every person with diabetes mellitus is different, there is not one meal plan that works for everyone. It is very important that you meet with a dietitian who will help you create a meal plan that is just right for you. This information is not intended to replace advice given to you by your health care provider. Make sure you discuss any questions you have with your health care provider. Document Released: 01/15/2005 Document Revised: 09/26/2015 Document Reviewed: 03/17/2013 Elsevier Interactive Patient Education  2017 [employer]  

## 2017-03-22 ENCOUNTER — Encounter: Payer: Self-pay | Admitting: Family Medicine

## 2017-03-23 ENCOUNTER — Other Ambulatory Visit: Payer: Self-pay | Admitting: Family Medicine

## 2017-03-23 MED ORDER — METFORMIN HCL 1000 MG PO TABS: 1000.0000 mg | ORAL_TABLET | Freq: Two times a day (BID) | ORAL | 1 refills | Status: DC

## 2017-03-23 MED ORDER — GLIPIZIDE ER 10 MG PO TB24: 10.0000 mg | ORAL_TABLET | Freq: Every day | ORAL | 1 refills | Status: DC

## 2017-03-31 ENCOUNTER — Telehealth: Payer: Self-pay | Admitting: Family Medicine

## 2017-03-31 NOTE — Telephone Encounter (Signed)
Pt returning  Phone  Call  Regarding   Lab  Results.  Plan of  Care  Discussed  Pt  Advised   Of  Medication  Changes . He was advised to  followup  With his pharmacy. He  verbalises  A  Knowledge of plan of care.

## 2017-04-08 ENCOUNTER — Encounter: Payer: Self-pay | Admitting: *Deleted

## 2017-06-18 ENCOUNTER — Ambulatory Visit: Payer: No Typology Code available for payment source | Admitting: Family Medicine

## 2017-07-09 ENCOUNTER — Ambulatory Visit: Payer: No Typology Code available for payment source | Admitting: Family Medicine

## 2017-07-09 ENCOUNTER — Encounter: Payer: Self-pay | Admitting: Family Medicine

## 2017-07-09 NOTE — Progress Notes (Deleted)
OFFICE VISIT  07/09/2017   CC: No chief complaint on file.    HPI:    Patient is a 32 y.o.  male who presents for 4 mo f/u DM 2, hypercholesterolemia, HTN.  DM: last A1c was signif improved but we increased his glipizide xl to 10mg  qAM to try to get him closer to goal A1c of 7%.   HTN:   HLD: last visit we started him on atorva 20mg  qd.  ROS:   Past Medical History:  Diagnosis Date  . Chronic low back pain    Facet syndrome per Dr. Wynn BankerKirsteins (2015)--referred to pain mgmt by [facility] 11/2012 and 05/2013.  . Diabetes mellitus (HCC) 2016   HbA1c 7.2%  . GAD (generalized anxiety disorder)   . Hypercholesterolemia    statin started 03/2017  . Hypertension   . Morbid obesity (HCC)    ? pickwickian syndrome  . OSA on CPAP   . Sinus tachycardia 2018    No past surgical history on file.  Outpatient Medications Prior to Visit  Medication Sig Dispense Refill  . atorvastatin (LIPITOR) 20 MG tablet Take 1 tablet (20 mg total) daily by mouth. 90 tablet 1  . FREESTYLE LITE test strip 1 each daily by Other route.    . gabapentin (NEURONTIN) 100 MG capsule 2 caps po tid (Patient not taking: Reported on 03/19/2017) 180 capsule 1  . glipiZIDE (GLUCOTROL XL) 10 MG 24 hr tablet Take 1 tablet (10 mg total) by mouth daily with breakfast. 90 tablet 1  . Lancets (FREESTYLE) lancets 1 each daily by Other route.    Marland Kitchen. lisinopril-hydrochlorothiazide (PRINZIDE,ZESTORETIC) 10-12.5 MG tablet TAKE ONE TABLET BY MOUTH ONCE DAILY. 30 tablet 6  . metFORMIN (GLUCOPHAGE) 1000 MG tablet Take 1 tablet (1,000 mg total) by mouth 2 (two) times daily with a meal. 180 tablet 1   No facility-administered medications prior to visit.     No Known Allergies  ROS As per HPI  PE: There were no vitals taken for this visit. ***  LABS:  Lab Results  Component Value Date   TSH 1.476 08/23/2012   Lab Results  Component Value Date   WBC 11.7 (A) 08/23/2012   HGB 15.5 08/23/2012   HCT 46.6 08/23/2012   MCV 87.9 08/23/2012   PLT 234 05/20/2012   Lab Results  Component Value Date   CREATININE 0.99 03/19/2017   BUN 10 03/19/2017   NA 135 03/19/2017   K 4.7 03/19/2017   CL 95 (L) 03/19/2017   CO2 32 03/19/2017   Lab Results  Component Value Date   ALT 44 10/20/2016   AST 33 10/20/2016   ALKPHOS 64 10/20/2016   BILITOT 0.6 10/20/2016   Lab Results  Component Value Date   CHOL 224 (H) 03/19/2017   Lab Results  Component Value Date   HDL 33.30 (L) 03/19/2017   Lab Results  Component Value Date   LDLCALC 162 (H) 03/19/2017   Lab Results  Component Value Date   TRIG 146.0 03/19/2017   Lab Results  Component Value Date   CHOLHDL 7 03/19/2017   Lab Results  Component Value Date   HGBA1C 8.1 (H) 03/19/2017    IMPRESSION AND PLAN:  No problem-specific Assessment & Plan notes found for this encounter.   FOLLOW UP: No Follow-up on file.

## 2017-08-12 ENCOUNTER — Encounter: Payer: Self-pay | Admitting: Family Medicine

## 2017-08-12 DIAGNOSIS — E7800 Pure hypercholesterolemia, unspecified: Secondary | ICD-10-CM | POA: Insufficient documentation

## 2017-08-13 ENCOUNTER — Ambulatory Visit: Payer: No Typology Code available for payment source | Admitting: Family Medicine

## 2017-08-13 NOTE — Progress Notes (Deleted)
OFFICE VISIT  08/13/2017   CC: No chief complaint on file.    HPI:    Patient is a 32 y.o. Caucasian male who presents for DM 2, HTN, hyperlipidemia. Last visit 03/2017 his A1c was much improved but not at goal so I increased his glipizide xl to 10mg  qAM. Also, I instructed him to start the atorvastatin that I had previously rx'd him.  Past Medical History:  Diagnosis Date  . Chronic low back pain    Facet syndrome per Dr. Wynn BankerKirsteins (2015)--referred to pain mgmt by [facility] 11/2012 and 05/2013.  . Diabetes mellitus (HCC) 2016   HbA1c 7.2%  . GAD (generalized anxiety disorder)   . Hypercholesterolemia    statin started 03/2017  . Hypertension   . Morbid obesity (HCC)    ? pickwickian syndrome  . OSA on CPAP   . Sinus tachycardia 2018    No past surgical history on file.  Outpatient Medications Prior to Visit  Medication Sig Dispense Refill  . atorvastatin (LIPITOR) 20 MG tablet Take 1 tablet (20 mg total) daily by mouth. 90 tablet 1  . FREESTYLE LITE test strip 1 each daily by Other route.    . gabapentin (NEURONTIN) 100 MG capsule 2 caps po tid (Patient not taking: Reported on 03/19/2017) 180 capsule 1  . glipiZIDE (GLUCOTROL XL) 10 MG 24 hr tablet Take 1 tablet (10 mg total) by mouth daily with breakfast. 90 tablet 1  . Lancets (FREESTYLE) lancets 1 each daily by Other route.    Marland Kitchen. lisinopril-hydrochlorothiazide (PRINZIDE,ZESTORETIC) 10-12.5 MG tablet TAKE ONE TABLET BY MOUTH ONCE DAILY. 30 tablet 6  . metFORMIN (GLUCOPHAGE) 1000 MG tablet Take 1 tablet (1,000 mg total) by mouth 2 (two) times daily with a meal. 180 tablet 1   No facility-administered medications prior to visit.     No Known Allergies  ROS As per HPI  PE: There were no vitals taken for this visit. ***  LABS:  Lab Results  Component Value Date   TSH 1.476 08/23/2012   Lab Results  Component Value Date   WBC 11.7 (A) 08/23/2012   HGB 15.5 08/23/2012   HCT 46.6 08/23/2012   MCV 87.9  08/23/2012   PLT 234 05/20/2012   Lab Results  Component Value Date   CREATININE 0.99 03/19/2017   BUN 10 03/19/2017   NA 135 03/19/2017   K 4.7 03/19/2017   CL 95 (L) 03/19/2017   CO2 32 03/19/2017   Lab Results  Component Value Date   ALT 44 10/20/2016   AST 33 10/20/2016   ALKPHOS 64 10/20/2016   BILITOT 0.6 10/20/2016   Lab Results  Component Value Date   CHOL 224 (H) 03/19/2017   Lab Results  Component Value Date   HDL 33.30 (L) 03/19/2017   Lab Results  Component Value Date   LDLCALC 162 (H) 03/19/2017   Lab Results  Component Value Date   TRIG 146.0 03/19/2017   Lab Results  Component Value Date   CHOLHDL 7 03/19/2017   Lab Results  Component Value Date   HGBA1C 8.1 (H) 03/19/2017    IMPRESSION AND PLAN:  No problem-specific Assessment & Plan notes found for this encounter. A1c CMET FLP  An After Visit Summary was printed and given to the patient.   FOLLOW UP: No follow-ups on file.  Signed:  Santiago BumpersPhil Cniyah Sproull, MD           08/13/2017

## 2017-08-27 ENCOUNTER — Ambulatory Visit: Payer: No Typology Code available for payment source | Admitting: Family Medicine

## 2017-08-27 ENCOUNTER — Encounter: Payer: Self-pay | Admitting: Family Medicine

## 2017-08-27 VITALS — BP 105/69 | HR 110 | Temp 98.4°F | Resp 16 | Ht 69.0 in | Wt 299.4 lb

## 2017-08-27 DIAGNOSIS — E119 Type 2 diabetes mellitus without complications: Secondary | ICD-10-CM

## 2017-08-27 DIAGNOSIS — I10 Essential (primary) hypertension: Secondary | ICD-10-CM

## 2017-08-27 DIAGNOSIS — E7800 Pure hypercholesterolemia, unspecified: Secondary | ICD-10-CM | POA: Diagnosis not present

## 2017-08-27 LAB — COMPREHENSIVE METABOLIC PANEL
ALT: 24 U/L (ref 0–53)
AST: 15 U/L (ref 0–37)
Albumin: 4.2 g/dL (ref 3.5–5.2)
Alkaline Phosphatase: 60 U/L (ref 39–117)
BUN: 10 mg/dL (ref 6–23)
CO2: 32 meq/L (ref 19–32)
CREATININE: 0.92 mg/dL (ref 0.40–1.50)
Calcium: 9.4 mg/dL (ref 8.4–10.5)
Chloride: 97 mEq/L (ref 96–112)
GFR: 100.73 mL/min (ref >60.00)
Glucose, Bld: 109 mg/dL — ABNORMAL HIGH (ref 70–99)
Potassium: 4.5 mEq/L (ref 3.5–5.1)
SODIUM: 138 meq/L (ref 135–145)
Total Bilirubin: 0.5 mg/dL (ref 0.2–1.2)
Total Protein: 7.9 g/dL (ref 6.0–8.3)

## 2017-08-27 LAB — LIPID PANEL
CHOL/HDL RATIO: 4
CHOLESTEROL: 132 mg/dL (ref 0–200)
HDL: 33.3 mg/dL — ABNORMAL LOW (ref >39.00)
LDL Cholesterol: 78 mg/dL (ref 0–99)
NonHDL: 98.66
Triglycerides: 101 mg/dL (ref 0.0–149.0)
VLDL: 20.2 mg/dL (ref 0.0–40.0)

## 2017-08-27 LAB — HEMOGLOBIN A1C: Hgb A1c MFr Bld: 7.6 % — ABNORMAL HIGH (ref 4.6–6.5)

## 2017-08-27 NOTE — Progress Notes (Signed)
OFFICE VISIT  08/27/2017   CC:  Chief Complaint  Patient presents with  . Follow-up    RCI, pt is fasting.    HPI:    Patient is a 32 y.o. Caucasian male who presents for 6 mo f/u DM 2, HTN, HLD.  DM: I increased his glipizide xl to 10mg  qd after last visit. Compliant with meds but no glucose monitoring.  No polyuria or polydipsia. Trying to eat more fruits and veggies. Active at work, plays with kids in evenings, otherwise no formal regimen.  HTN: monitoring regularly with wrist cuff 120/70s consistently.  HLD: Last visit I advised him to start the atorvastatin that I had previously prescribed him. No side effects felt from this.  ROS: no dizziness, CP, palpitations, myalgias, CP, or SOB.  No HAs.  Past Medical History:  Diagnosis Date  . Chronic low back pain    Facet syndrome per Dr. Wynn Banker (2015)--referred to pain mgmt by [facility] 11/2012 and 05/2013.  . Diabetes mellitus (HCC) 2016   HbA1c 7.2%  . GAD (generalized anxiety disorder)   . Hypercholesterolemia    statin started 03/2017  . Hypertension   . Morbid obesity (HCC)    ? pickwickian syndrome  . OSA on CPAP   . Sinus tachycardia 2018    History reviewed. No pertinent surgical history.  Outpatient Medications Prior to Visit  Medication Sig Dispense Refill  . atorvastatin (LIPITOR) 20 MG tablet Take 1 tablet (20 mg total) daily by mouth. 90 tablet 1  . FREESTYLE LITE test strip 1 each daily by Other route.    . gabapentin (NEURONTIN) 100 MG capsule 2 caps po tid 180 capsule 1  . glipiZIDE (GLUCOTROL XL) 10 MG 24 hr tablet Take 1 tablet (10 mg total) by mouth daily with breakfast. 90 tablet 1  . Lancets (FREESTYLE) lancets 1 each daily by Other route.    Marland Kitchen lisinopril-hydrochlorothiazide (PRINZIDE,ZESTORETIC) 10-12.5 MG tablet TAKE ONE TABLET BY MOUTH ONCE DAILY. 30 tablet 6  . metFORMIN (GLUCOPHAGE) 1000 MG tablet Take 1 tablet (1,000 mg total) by mouth 2 (two) times daily with a meal. 180 tablet 1   No  facility-administered medications prior to visit.     No Known Allergies  ROS As per HPI  PE: Blood pressure 105/69, pulse (!) 110, temperature 98.4 F (36.9 C), temperature source Oral, resp. rate 16, height 5\' 9"  (1.753 m), weight 299 lb 6 oz (135.8 kg), SpO2 97 %. Body mass index is 44.21 kg/m.  Gen: Alert, well appearing.  Patient is oriented to person, place, time, and situation. AFFECT: pleasant, lucid thought and speech. CV: RRR, no m/r/g.   LUNGS: CTA bilat, nonlabored resps, good aeration in all lung fields. EXT: trace-1+ pitting edema bilat LL's.  No clubbing or cyanosis.  LABS:  Lab Results  Component Value Date   TSH 1.476 08/23/2012   Lab Results  Component Value Date   WBC 11.7 (A) 08/23/2012   HGB 15.5 08/23/2012   HCT 46.6 08/23/2012   MCV 87.9 08/23/2012   PLT 234 05/20/2012   Lab Results  Component Value Date   CREATININE 0.99 03/19/2017   BUN 10 03/19/2017   NA 135 03/19/2017   K 4.7 03/19/2017   CL 95 (L) 03/19/2017   CO2 32 03/19/2017   Lab Results  Component Value Date   ALT 44 10/20/2016   AST 33 10/20/2016   ALKPHOS 64 10/20/2016   BILITOT 0.6 10/20/2016   Lab Results  Component Value Date  CHOL 224 (H) 03/19/2017   Lab Results  Component Value Date   HDL 33.30 (L) 03/19/2017   Lab Results  Component Value Date   LDLCALC 162 (H) 03/19/2017   Lab Results  Component Value Date   TRIG 146.0 03/19/2017   Lab Results  Component Value Date   CHOLHDL 7 03/19/2017   Lab Results  Component Value Date   HGBA1C 8.1 (H) 03/19/2017    IMPRESSION AND PLAN:  1) DM 2: No home monitoring. Hba1c today. We'll make opthal referral for diab retpthy screening exam. 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  2) HTN: The current medical regimen is effective;  continue present plan and medications. Lytes/Cr today.  3) HLD: tolerating statin. Recheck FLP today.  Hepatic panel as well.  An After Visit Summary was printed and given to  the patient.  FOLLOW UP: Return in about 3 months (around 11/26/2017) for routine chronic illness f/u.  Signed:  Santiago BumpersPhil Kendalynn Wideman, MD           08/27/2017

## 2017-08-27 NOTE — Patient Instructions (Signed)
Buy a "metatarsal pad" shoe insert to put in shoe to help your pain in the ball of your foot. Continue taking your gabapentin.

## 2017-08-30 ENCOUNTER — Other Ambulatory Visit: Payer: Self-pay | Admitting: *Deleted

## 2017-08-30 MED ORDER — PIOGLITAZONE HCL 15 MG PO TABS: 15.0000 mg | ORAL_TABLET | Freq: Every day | ORAL | 6 refills | Status: DC

## 2017-09-04 ENCOUNTER — Other Ambulatory Visit: Payer: Self-pay | Admitting: Family Medicine

## 2017-10-14 ENCOUNTER — Encounter: Payer: Self-pay | Admitting: Family Medicine

## 2017-10-14 LAB — HM DIABETES EYE EXAM

## 2017-10-18 ENCOUNTER — Encounter: Payer: Self-pay | Admitting: Family Medicine

## 2017-10-18 ENCOUNTER — Ambulatory Visit: Payer: 59 | Admitting: Family Medicine

## 2017-10-18 VITALS — BP 90/66 | HR 115 | Temp 98.3°F | Resp 20 | Ht 69.0 in | Wt 300.0 lb

## 2017-10-18 DIAGNOSIS — J039 Acute tonsillitis, unspecified: Secondary | ICD-10-CM

## 2017-10-18 DIAGNOSIS — J029 Acute pharyngitis, unspecified: Secondary | ICD-10-CM

## 2017-10-18 DIAGNOSIS — R000 Tachycardia, unspecified: Secondary | ICD-10-CM | POA: Diagnosis not present

## 2017-10-18 LAB — POCT RAPID STREP A (OFFICE): RAPID STREP A SCREEN: NEGATIVE

## 2017-10-18 MED ORDER — AMOXICILLIN-POT CLAVULANATE 875-125 MG PO TABS: 1.0000 | ORAL_TABLET | Freq: Two times a day (BID) | ORAL | 0 refills | Status: DC

## 2017-10-18 MED ORDER — METHYLPREDNISOLONE ACETATE 40 MG/ML IJ SUSP
40.0000 mg | Freq: Once | INTRAMUSCULAR | Status: AC
  Administered 2017-10-18: 40 mg via INTRAMUSCULAR

## 2017-10-18 MED ORDER — METHYLPREDNISOLONE ACETATE 80 MG/ML IJ SUSP
80.0000 mg | Freq: Once | INTRAMUSCULAR | Status: AC
  Administered 2017-10-18: 80 mg via INTRAMUSCULAR

## 2017-10-18 NOTE — Progress Notes (Signed)
Eric Hardy , 05/09/1985, 32 y.o., male MRN: [id number] Patient Care Team    Relationship Specialty Notifications Start End  McGowen, Maryjean MornPhilip H, MD PCP - General Family Medicine  10/20/16   Aquilla HackerNordbladh, Louise, PA-C Referring Physician Physician Assistant  08/18/12     Chief Complaint  Patient presents with  . URI    sore throat,drainage x 1 week     Subjective: Pt presents for an OV with complaints of Sore throat of 1 week  duration.  Associated symptoms include nasal drainage, chills, headache and an increase in green phlegm production. He reports he felt he was getting better and ten over the weekend became worse, his entire family is now ill. He has not been drink much water. He has taken his BP meds he took a few different OTC meds, not sudafed.   Depression screen PHQ 2/9 03/19/2017  Decreased Interest 0  Down, Depressed, Hopeless 0  PHQ - 2 Score 0    No Known Allergies Social History   Tobacco Use  . Smoking status: Never Smoker  . Smokeless tobacco: Never Used  Substance Use Topics  . Alcohol use: Yes    Comment: socially   Past Medical History:  Diagnosis Date  . Chronic low back pain    Facet syndrome per Dr. Wynn BankerKirsteins (2015)--referred to pain mgmt by [facility] 11/2012 and 05/2013.  . Diabetes mellitus (HCC) 2016   HbA1c 7.2%  . GAD (generalized anxiety disorder)   . Hypercholesterolemia    statin started 03/2017  . Hypertension   . Morbid obesity (HCC)    ? pickwickian syndrome  . OSA on CPAP   . Sinus tachycardia 2018   History reviewed. No pertinent surgical history. Family History  Problem Relation Age of Onset  . Hypertension Mother   . Hypertension Maternal Grandmother   . Heart attack Maternal Grandfather   . Hypertension Paternal Grandmother   . Arthritis Paternal Grandmother   . Hyperlipidemia Paternal Grandmother   . Hypertension Paternal Grandfather    Allergies as of 10/18/2017   No Known Allergies     Medication List        Accurate as of 10/18/17  1:00 PM. Always use your most recent med list.          atorvastatin 20 MG tablet Commonly known as:  LIPITOR Take 1 tablet (20 mg total) daily by mouth.   freestyle lancets 1 each daily by Other route.   FREESTYLE LITE test strip Generic drug:  glucose blood 1 each daily by Other route.   gabapentin 100 MG capsule Commonly known as:  NEURONTIN 2 caps po tid   GLIPIZIDE XL 10 MG 24 hr tablet Generic drug:  glipiZIDE TAKE 1 TABLET BY MOUTH ONCE DAILY WITH  BREAKFAST   lisinopril-hydrochlorothiazide 10-12.5 MG tablet Commonly known as:  PRINZIDE,ZESTORETIC TAKE 1 TABLET BY MOUTH ONCE DAILY   metFORMIN 1000 MG tablet Commonly known as:  GLUCOPHAGE Take 1 tablet (1,000 mg total) by mouth 2 (two) times daily with a meal.   pioglitazone 15 MG tablet Commonly known as:  ACTOS Take 1 tablet (15 mg total) by mouth daily.       All past medical history, surgical history, allergies, family history, immunizations andmedications were updated in the EMR today and reviewed under the history and medication portions of their EMR.     ROS: Negative, with the exception of above mentioned in HPI   Objective:  BP 90/66 (BP Location: Left Arm, Patient  Position: Sitting, Cuff Size: Large)   Pulse (!) 115   Temp 98.3 F (36.8 C)   Resp 20   Ht 5\' 9"  (1.753 m)   Wt 300 lb (136.1 kg)   SpO2 96%   BMI 44.30 kg/m  Body mass index is 44.3 kg/m. Gen: Afebrile. No acute distress. Nontoxic in appearance, well developed, well nourished. Obese. HENT: AT. [place]. Bilateral TM visualized with flaky dry EAM. MMM, no oral lesions. Bilateral nares with erythema and drainage. Throat with erythema, very small posterior pharynx with ?enlarge tonsils. Exam was difficult.  Eyes:Pupils Equal Round Reactive to light, Extraocular movements intact,  Conjunctiva without redness, discharge or icterus. Neck/lymp/endocrine: Supple,large right ant cervical lymphadenopathy (full long  beard) CV: RRR no murmur Chest: CTAB, no wheeze or crackles. Good air movement, normal resp effort.  Abd: Soft. NTND. BS present.  Skin: no rashes, purpura or petechiae.  Neuro:  Normal gait. PERLA. EOMi. Alert. Oriented x3   No exam data present No results found. No results found for this or any previous visit (from the past 24 hour(s)).  Assessment/Plan: Eric Hardy is a 32 y.o. male present for OV for  Sore throat/tonsillitis/tachycardia - VS are concerning, low BP and tachycardic. Exam difficult with habitus and full beard.  - hold lisinopril HCTZ for 2 days. PUSH FLUIDS!!! - IM depo medrol injection today. Start OTC mucinex DM. - Augmentin BID prescribed - POCT rapid strep A--> negative--> resp culture sent.  - methylPREDNISolone acetate (DEPO-MEDROL) injection 120 mg - Upper Respiratory Culture - close followup in 3 days, advised to go to ED if worsening, difficulty swallowing or inability to tolerate meds concern for tonsillar abscess.     Reviewed expectations re: course of current medical issues.  Discussed self-management of symptoms.  Outlined signs and symptoms indicating need for more acute intervention.  Patient verbalized understanding and all questions were answered.  Patient received an After-Visit Summary.    Orders Placed This Encounter  Procedures  . POCT rapid strep A     Note is dictated utilizing voice recognition software. Although note has been proof read prior to signing, occasional typographical errors still can be missed. If any questions arise, please do not hesitate to call for verification.   electronically signed by:  Felix Pacini, DO  [facility] [place]

## 2017-10-18 NOTE — Patient Instructions (Addendum)
1. Do not take lisinopril (BP med) for 2 days.  2. Drink water!!!!! G2 or Gzero also.  3. Steroid shot today may cause temporary increase in your sugars, but relieve swelling in your throat 4. Start Augmentin today and use every 12 hours for 10 days.  5 Follow up thurs/friday of not improving 6 if worsening or you can tolerate pills/swallowing etc you should go to ED.   start over the counter mucinex DM  Tonsillitis Tonsillitis is an infection of the throat. This infection causes the tonsils to become red, tender, and swollen. Tonsils are tissues in the back of your throat. If bacteria caused your infection, antibiotic medicine will be given to you. Sometimes, symptoms of this infection can be helped with the use of steroid medicine. If your tonsillitis is very bad (severe) and happens often, you may need to get your tonsils removed (tonsillectomy). Follow these instructions at home: Medicines  Take over-the-counter and prescription medicines only as told by your doctor.  If you were prescribed an antibiotic, take it as told by your doctor. Do not stop taking the antibiotic even if you start to feel better. Eating and drinking  Drink enough fluid to keep your pee (urine) clear or pale yellow.  While your throat is sore, eat soft or liquid foods like: ? Soup. ? Sherbert. ? Instant breakfast drinks.  Drink warm fluids.  Eat frozen ice pops. General instructions  Rest as much as possible and get plenty of sleep.  Gargle with a salt-water mixture 3-4 times a day or as needed. To make a salt-water mixture, completely dissolve -1 tsp of salt in 1 cup of warm water.  Wash your hands often with soap and water. If there is no soap and water, use hand sanitizer.  Do not share cups, bottles, or other utensils until your symptoms are gone.  Do not smoke. If you need help quitting, ask your doctor.  Keep all follow-up visits as told by your doctor. This is important. Contact a doctor  if:  You have large, tender lumps in your neck.  You have a fever that does not go away after 2-3 days.  You have a rash.  You cough up green, yellow-brown, or bloody fluid.  You cannot swallow liquids or food for 24 hours.  Only one of your tonsils is swollen. Get help right away if:  You have any new symptoms such as: ? Vomiting ? Very bad headache ? Stiff neck ? Chest pain ? Trouble breathing or swallowing  You have very bad throat pain and also have drooling or voice changes.  You have very bad pain that is not helped by medicine.  You cannot fully open your mouth.  You have redness, swelling, or severe pain anywhere in your neck. Summary  Tonsillitis causes your tonsils to be red, tender, and swollen.  While your throat is sore eat soft or liquid foods.  Gargle with a salt-water mixture 3-4 times a day or as needed.  Do not share cups, bottles, or other utensils until your symptoms are gone. This information is not intended to replace advice given to you by your health care provider. Make sure you discuss any questions you have with your health care provider. Document Released: 10/07/2007 Document Revised: 09/26/2015 Document Reviewed: 10/07/2012 Elsevier Interactive Patient Education  2017 [employer]

## 2017-10-20 ENCOUNTER — Telehealth: Payer: Self-pay | Admitting: Family Medicine

## 2017-10-20 LAB — CULTURE, UPPER RESPIRATORY
MICRO NUMBER: 90722658
SPECIMEN QUALITY: ADEQUATE

## 2017-10-20 NOTE — Telephone Encounter (Signed)
Please inform patient the following information: His throat culture is positive for heavy growth of strep A (strep throat) He was provided with steroid shot and Augmentin. Please make sure he takes medication as directed for the full 10 days. Follow up in 2 weeks if not resolved.

## 2017-10-20 NOTE — Telephone Encounter (Signed)
Spoke with patient reviewed lab results and instructions. Patient verbalized understanding. 

## 2017-11-25 ENCOUNTER — Other Ambulatory Visit: Payer: Self-pay | Admitting: Family Medicine

## 2017-11-26 ENCOUNTER — Ambulatory Visit: Payer: No Typology Code available for payment source | Admitting: Family Medicine

## 2017-12-09 ENCOUNTER — Other Ambulatory Visit: Payer: Self-pay | Admitting: Family Medicine

## 2017-12-10 ENCOUNTER — Ambulatory Visit: Payer: 59 | Admitting: Family Medicine

## 2017-12-17 ENCOUNTER — Ambulatory Visit: Payer: 59 | Admitting: Family Medicine

## 2017-12-17 DIAGNOSIS — Z0289 Encounter for other administrative examinations: Secondary | ICD-10-CM

## 2017-12-17 NOTE — Progress Notes (Deleted)
OFFICE VISIT  12/17/2017   CC: No chief complaint on file.    HPI:    Patient is a 32 y.o. Caucasian male who presents for f/u DM 2, HTN, HLD.  Past Medical History:  Diagnosis Date  . Chronic low back pain    Facet syndrome per Dr. Wynn BankerKirsteins (2015)--referred to pain mgmt by [facility] 11/2012 and 05/2013.  . Diabetes mellitus (HCC) 2016   HbA1c 7.2%  . GAD (generalized anxiety disorder)   . Hypercholesterolemia    statin started 03/2017  . Hypertension   . Morbid obesity (HCC)    ? pickwickian syndrome  . OSA on CPAP   . Sinus tachycardia 2018    No past surgical history on file.  Outpatient Medications Prior to Visit  Medication Sig Dispense Refill  . amoxicillin-clavulanate (AUGMENTIN) 875-125 MG tablet Take 1 tablet by mouth 2 (two) times daily. 20 tablet 0  . atorvastatin (LIPITOR) 20 MG tablet TAKE 1 TABLET BY MOUTH ONCE DAILY 90 tablet 0  . FREESTYLE LITE test strip 1 each daily by Other route.    . gabapentin (NEURONTIN) 100 MG capsule 2 caps po tid 180 capsule 1  . GLIPIZIDE XL 10 MG 24 hr tablet TAKE 1 TABLET BY MOUTH ONCE DAILY WITH  BREAKFAST 90 tablet 1  . Lancets (FREESTYLE) lancets 1 each daily by Other route.    Marland Kitchen. lisinopril-hydrochlorothiazide (PRINZIDE,ZESTORETIC) 10-12.5 MG tablet TAKE 1 TABLET BY MOUTH ONCE DAILY 90 tablet 1  . metFORMIN (GLUCOPHAGE) 1000 MG tablet Take 1 tablet (1,000 mg total) by mouth 2 (two) times daily with a meal. 180 tablet 1  . metFORMIN (GLUCOPHAGE) 1000 MG tablet TAKE 1 TABLET BY MOUTH TWICE DAILY WITH A MEAL 60 tablet 0  . pioglitazone (ACTOS) 15 MG tablet Take 1 tablet (15 mg total) by mouth daily. 30 tablet 6   No facility-administered medications prior to visit.     No Known Allergies  ROS As per HPI  PE: There were no vitals taken for this visit. ***  LABS:  Lab Results  Component Value Date   TSH 1.476 08/23/2012   Lab Results  Component Value Date   WBC 11.7 (A) 08/23/2012   HGB 15.5 08/23/2012   HCT  46.6 08/23/2012   MCV 87.9 08/23/2012   PLT 234 05/20/2012   Lab Results  Component Value Date   CREATININE 0.92 08/27/2017   BUN 10 08/27/2017   NA 138 08/27/2017   K 4.5 08/27/2017   CL 97 08/27/2017   CO2 32 08/27/2017   Lab Results  Component Value Date   ALT 24 08/27/2017   AST 15 08/27/2017   ALKPHOS 60 08/27/2017   BILITOT 0.5 08/27/2017   Lab Results  Component Value Date   CHOL 132 08/27/2017   Lab Results  Component Value Date   HDL 33.30 (L) 08/27/2017   Lab Results  Component Value Date   LDLCALC 78 08/27/2017   Lab Results  Component Value Date   TRIG 101.0 08/27/2017   Lab Results  Component Value Date   CHOLHDL 4 08/27/2017   Lab Results  Component Value Date   HGBA1C 7.6 (H) 08/27/2017    IMPRESSION AND PLAN:  No problem-specific Assessment & Plan notes found for this encounter.  Pneumovax due  An After Visit Summary was printed and given to the patient.  FOLLOW UP: No follow-ups on file.  Signed:  Santiago BumpersPhil Giovanni Biby, MD           12/17/2017

## 2018-09-06 ENCOUNTER — Other Ambulatory Visit: Payer: Self-pay | Admitting: Family Medicine

## 2018-10-12 ENCOUNTER — Other Ambulatory Visit: Payer: Self-pay | Admitting: Family Medicine

## 2018-10-14 ENCOUNTER — Ambulatory Visit (INDEPENDENT_AMBULATORY_CARE_PROVIDER_SITE_OTHER): Payer: 59 | Admitting: Family Medicine

## 2018-10-14 ENCOUNTER — Other Ambulatory Visit: Payer: Self-pay

## 2018-10-14 ENCOUNTER — Encounter: Payer: Self-pay | Admitting: Family Medicine

## 2018-10-14 VITALS — BP 116/86 | Temp 98.9°F | Ht 69.0 in

## 2018-10-14 DIAGNOSIS — M79672 Pain in left foot: Secondary | ICD-10-CM | POA: Diagnosis not present

## 2018-10-14 MED ORDER — GABAPENTIN 300 MG PO CAPS: 300.0000 mg | ORAL_CAPSULE | Freq: Three times a day (TID) | ORAL | 2 refills | Status: DC

## 2018-10-14 NOTE — Progress Notes (Signed)
VIRTUAL VISIT VIA VIDEO  I connected with Eric Hardy on 10/14/18 at  9:30 AM EDT by a video enabled telemedicine application and verified that I am speaking with the correct person using two identifiers. Location patient: Home Location provider: [facility] [place], [facility] Persons participating in the virtual visit: Patient, Eric Hardy and Eric Hardy  I discussed the limitations of evaluation and management by telemedicine and the availability of in person appointments. The patient expressed understanding and agreed to proceed.   SUBJECTIVE Chief Complaint  Patient presents with  . Foot Pain    Left foot pain that has been going on for about a year. Pt has seen Eric Hardy and has tried the pads and chaged shoes with no relief.     HPI: Eric Hardy is a 33 y.o. male patient of Eric. Milinda Hardy present today for chronic foot pain.  She reports 8-12 months worth of left foot pain that has not improved.  He does stand on his feet working on a hard surface all day.  He is obese.  He has discussed this with his PCP in the past and they have tried different types of shoes and metatarsal padding, nothing has given him comfort. He is a diabetic.  He has chronic neuropathy and stopped gabapentin 200 mg 3 times daily secondary to ineffectiveness. ROS: See pertinent positives and negatives per HPI.  Patient Active Problem List   Diagnosis Date Noted  . Hypercholesterolemia   . Essential hypertension 09/19/2014  . Other depressive episodes 09/19/2014  . GAD (generalized anxiety disorder) 09/19/2014  . Back pain 03/20/2013  . Hypertension 02/10/2013  . Metabolic syndrome X 02/10/2013  . Hypertriglyceridemia 02/10/2013  . Lumbar radiculopathy, chronic 11/16/2012  . Morbid obesity (HCC) 11/16/2012  . Generalized anxiety disorder 11/16/2012  . Severe obstructive sleep apnea 08/31/2012    Social History   Tobacco Use  . Smoking status: Never Smoker  . Smokeless tobacco: Never  Used  Substance Use Topics  . Alcohol use: Yes    Comment: socially    Current Outpatient Medications:  .  atorvastatin (LIPITOR) 20 MG tablet, TAKE 1 TABLET BY MOUTH ONCE DAILY, Disp: 90 tablet, Rfl: 0 .  FREESTYLE LITE test strip, 1 each daily by Other route., Disp: , Rfl:  .  glipiZIDE (GLUCOTROL XL) 10 MG 24 hr tablet, Take 1 tablet (10 mg total) by mouth daily with breakfast. OFFICE VISIT NEEDED, Disp: 31 tablet, Rfl: 0 .  Lancets (FREESTYLE) lancets, 1 each daily by Other route., Disp: , Rfl:  .  lisinopril-hydrochlorothiazide (PRINZIDE,ZESTORETIC) 10-12.5 MG tablet, TAKE 1 TABLET BY MOUTH ONCE DAILY, Disp: 90 tablet, Rfl: 1 .  metFORMIN (GLUCOPHAGE) 1000 MG tablet, Take 1 tablet (1,000 mg total) by mouth 2 (two) times daily with a meal. OFFICE VISIT NEEDED FOR FURTHER REFILLS, Disp: 60 tablet, Rfl: 0 .  gabapentin (NEURONTIN) 300 MG capsule, Take 1 capsule (300 mg total) by mouth 3 (three) times daily. 2 caps po tid, Disp: 90 capsule, Rfl: 2 .  pioglitazone (ACTOS) 15 MG tablet, Take 1 tablet (15 mg total) by mouth daily. OFFICE VISIT NEEDED (Patient not taking: Reported on 10/14/2018), Disp: 30 tablet, Rfl: 0  No Known Allergies  OBJECTIVE: BP 116/86   Temp 98.9 F (37.2 C) (Oral)   Ht 5\' 9"  (1.753 m)   BMI 44.30 kg/m  Gen: No acute distress. Nontoxic in appearance. Obese caucasian male.  HENT: AT. [place].  MMM.  Eyes:Pupils Equal Round Reactive to light, Extraocular  movements intact,  Conjunctiva without redness, discharge or icterus. Chest: Cough or shortness of breath not present.   Neuro:  Normal gait. Alert. Oriented x3  Psych: Normal affect, dress and demeanor. Normal speech. Normal thought content and judgment.  ASSESSMENT AND PLAN: [name] is a 33 y.o. male present for  Left foot pain -Patient presents today with 8 to 12 months of left foot pain between second and third toes.  Pain has persisted despite conservative measures.  Discussed differential diagnosis  with him today including metatarsalgia and Morton's neuroma.  He would like to proceed with further evaluation.  Refer to podiatry placed today and discussed potential treatment measures available for both conditions. -He also has neuropathy that was present from his diabetes prior to onset of above condition.  Had stopped his gabapentin secondary to not feeling medication was effective.  Discussed with him today that he was on very low-dose and since it did not make him sedated would suggest he restart at a higher dose.  He is agreeable to this today, gabapentin 300 mg 3 times daily prescribed.  - Patient will follow-up with his PCP in 1 to 2 weeks to discuss his chronic medical conditions.  He has been lost to follow-up on the secondary to change in insurance.  Orders Placed This Encounter  Procedures  . Ambulatory referral to Podiatry   > 15 minutes spent with patient, > 50% of that time face to face   Eric Pouch, DO 10/14/2018

## 2018-10-28 ENCOUNTER — Ambulatory Visit: Payer: 59 | Admitting: Family Medicine

## 2018-10-30 ENCOUNTER — Other Ambulatory Visit: Payer: Self-pay | Admitting: Family Medicine

## 2018-11-11 ENCOUNTER — Ambulatory Visit: Payer: 59 | Admitting: Family Medicine

## 2018-11-25 ENCOUNTER — Ambulatory Visit: Payer: 59 | Admitting: Family Medicine

## 2018-11-25 DIAGNOSIS — Z0289 Encounter for other administrative examinations: Secondary | ICD-10-CM

## 2018-11-25 NOTE — Progress Notes (Deleted)
OFFICE VISIT  11/25/2018   CC: No chief complaint on file.    HPI:    Patient is a 33 y.o. Caucasian male who presents for f/u diab periph neurop. Dr. Claiborne BillingsKuneff started him back on gabapentin 300 mg tid about 6 wks ago.  He was also referred to podiatry at that time for chronic left foot pain between 2nd and 3rd toes/plantar surface.  I have not seen him for f/u DM, HTN, HLD in over 15 months.  Interim hx: ***  Past Medical History:  Diagnosis Date  . Chronic low back pain    Facet syndrome per Dr. Wynn BankerKirsteins (2015)--referred to pain mgmt by [facility] 11/2012 and 05/2013.  . Diabetes mellitus (HCC) 2016   HbA1c 7.2%  . GAD (generalized anxiety disorder)   . Hypercholesterolemia    statin started 03/2017  . Hypertension   . Morbid obesity (HCC)    ? pickwickian syndrome  . OSA on CPAP   . Sinus tachycardia 2018    No past surgical history on file.  Outpatient Medications Prior to Visit  Medication Sig Dispense Refill  . atorvastatin (LIPITOR) 20 MG tablet TAKE 1 TABLET BY MOUTH ONCE DAILY 90 tablet 0  . FREESTYLE LITE test strip 1 each daily by Other route.    . gabapentin (NEURONTIN) 300 MG capsule Take 1 capsule (300 mg total) by mouth 3 (three) times daily. 2 caps po tid 90 capsule 2  . glipiZIDE (GLUCOTROL XL) 10 MG 24 hr tablet Take 1 tablet (10 mg total) by mouth daily with breakfast. OFFICE VISIT NEEDED 31 tablet 0  . Lancets (FREESTYLE) lancets 1 each daily by Other route.    Marland Kitchen. lisinopril-hydrochlorothiazide (ZESTORETIC) 10-12.5 MG tablet Take 1 tablet by mouth once daily 14 tablet 0  . metFORMIN (GLUCOPHAGE) 1000 MG tablet Take 1 tablet (1,000 mg total) by mouth 2 (two) times daily with a meal. OFFICE VISIT NEEDED FOR FURTHER REFILLS 60 tablet 0  . pioglitazone (ACTOS) 15 MG tablet Take 1 tablet (15 mg total) by mouth daily. OFFICE VISIT NEEDED (Patient not taking: Reported on 10/14/2018) 30 tablet 0   No facility-administered medications prior to visit.     No  Known Allergies  ROS As per HPI  PE: There were no vitals taken for this visit. ***  LABS:  Lab Results  Component Value Date   TSH 1.476 08/23/2012   Lab Results  Component Value Date   WBC 11.7 (A) 08/23/2012   HGB 15.5 08/23/2012   HCT 46.6 08/23/2012   MCV 87.9 08/23/2012   PLT 234 05/20/2012   Lab Results  Component Value Date   CREATININE 0.92 08/27/2017   BUN 10 08/27/2017   NA 138 08/27/2017   K 4.5 08/27/2017   CL 97 08/27/2017   CO2 32 08/27/2017   Lab Results  Component Value Date   ALT 24 08/27/2017   AST 15 08/27/2017   ALKPHOS 60 08/27/2017   BILITOT 0.5 08/27/2017   Lab Results  Component Value Date   CHOL 132 08/27/2017   Lab Results  Component Value Date   HDL 33.30 (L) 08/27/2017   Lab Results  Component Value Date   LDLCALC 78 08/27/2017   Lab Results  Component Value Date   TRIG 101.0 08/27/2017   Lab Results  Component Value Date   CHOLHDL 4 08/27/2017   Lab Results  Component Value Date   HGBA1C 7.6 (H) 08/27/2017    IMPRESSION AND PLAN:  No problem-specific Assessment &  Plan notes found for this encounter.  A1c, CMET, FLP.  An After Visit Summary was printed and given to the patient.  FOLLOW UP: No follow-ups on file.  Signed:  Crissie Sickles, MD           11/25/2018

## 2019-03-03 ENCOUNTER — Ambulatory Visit (HOSPITAL_COMMUNITY)
Admission: RE | Admit: 2019-03-03 | Discharge: 2019-03-03 | Disposition: A | Discharge status: Home / Self Care | Payer: 59 | Attending: Psychiatry | Admitting: Psychiatry

## 2019-03-03 DIAGNOSIS — F329 Major depressive disorder, single episode, unspecified: Secondary | ICD-10-CM | POA: Diagnosis present

## 2019-03-03 DIAGNOSIS — F1420 Cocaine dependence, uncomplicated: Secondary | ICD-10-CM | POA: Insufficient documentation

## 2019-03-03 NOTE — H&P (Signed)
Behavioral Health Medical Screening Exam  Eric Hardy is an 32 y.o. male requesting assistance with detox and substance abuse. He states he needs help with his cocaine problem. He states he uses about $150 of cocaine a week. He states he uses cocaine to deal with emotional pain. He does not elaborate what this emotional pain is. He denies any depressive symptoms, anxiety, sadness, withdrawn symptoms. He denies any si/hi/avh.   Total Time spent with patient: 20 minutes  Psychiatric Specialty Exam: Physical Exam  ROS  There were no vitals taken for this visit.There is no height or weight on file to calculate BMI.  General Appearance: Fairly Groomed  Eye Contact:  Fair  Speech:  Clear and Coherent and Normal Rate  Volume:  Normal  Mood:  Euthymic  Affect:  Congruent  Thought Process:  Coherent, Goal Directed, Linear and Descriptions of Associations: Intact  Orientation:  Full (Time, Place, and Person)  Thought Content:  WDL  Suicidal Thoughts:  No  Homicidal Thoughts:  No  Memory:  Immediate;   Good Recent;   Good  Judgement:  Intact  Insight:  Fair and Present  Psychomotor Activity:  Normal  Concentration: Concentration: Fair and Attention Span: Fair  Recall:  [employer] of Knowledge:Fair  Language: Fair  Akathisia:  No  Handed:  Right  AIMS (if indicated):     Assets:  Communication Skills Desire for Improvement Financial Resources/Insurance Housing Leisure Time [place] Talents/Skills Transportation Vocational/Educational  Sleep:       Musculoskeletal: Strength & Muscle Tone: within normal limits Gait & Station: normal Patient leans: N/A  There were no vitals taken for this visit.  Recommendations:  Based on my evaluation the patient does not appear to have an emergency medical condition.Will discharge home with outpatient resources at this time.   Suella Broad, FNP 03/03/2019, 3:12 PM

## 2019-03-03 NOTE — BH Assessment (Signed)
Assessment Note  Eric SierrasVirgil L Hardy is an 33 y.o. male who presented to [facility] seeking help for his cocaine problem.  Patient states that he first tried cocaine at age 33, but had not used for 12 years until one year ago when he resumed using and states that he is currently using $120-140 weekly using 1-2 times weekly.  Patient states that he has never talked to anyone about his issues and came today to get some help.  Patient states that he can put it down and states that he is not addicted.  TTS inquired to why he was being assessed if he did not have a problem and he could not give a solid answer.  Patient states that he uses in order to deal with emotional pain. Patient denies any history of SI/HI/Psychosis. Patient states that he has never had any thoughts of hurting himself or others.  Patient states that he is currently sleeping 7 hours per night and states that he is eating well.  Patient states that he is divorced and has primary custody of his two daughters ages 308 and [id number].  He states that he has struggle with anxiety in the past when he was going through a divorce, but states that since he is divorced that he non longer has any anxiety.  Patient states that he works in [employer].  Patient presented as oriented and alert.  He appeared to be depressed and his affect was flat.  He did not appear to be responding to any internal stimuli.  His judgment, insight and impulse control appeared to be impaired.  His eye contact was good and his speech coherent.  Diagnosis: F14.20 Cocaine Use Disorder Severe  Past Medical History:  Past Medical History:  Diagnosis Date  . Chronic low back pain    Facet syndrome per Dr. Wynn BankerKirsteins (2015)--referred to pain mgmt by [facility] 11/2012 and 05/2013.  . Diabetes mellitus (HCC) 2016   HbA1c 7.2%  . GAD (generalized anxiety disorder)   . Hypercholesterolemia    statin started 03/2017  . Hypertension   . Morbid obesity (HCC)    ? pickwickian syndrome  . OSA  on CPAP   . Sinus tachycardia 2018    No past surgical history on file.  Family History:  Family History  Problem Relation Age of Onset  . Hypertension Mother   . Hypertension Maternal Grandmother   . Heart attack Maternal Grandfather   . Hypertension Paternal Grandmother   . Arthritis Paternal Grandmother   . Hyperlipidemia Paternal Grandmother   . Hypertension Paternal Grandfather     Social History:  reports that he has never smoked. He has never used smokeless tobacco. He reports current alcohol use. He reports that he does not use drugs.  Additional Social History:  Alcohol / Drug Use Pain Medications: see MAR Prescriptions: see MAR Over the Counter: see MAR History of alcohol / drug use?: Yes Longest period of sobriety (when/how long): states that he was clean for 12 years in the past Negative Consequences of Use: Financial, Work / School Substance #1 Name of Substance 1: cocaine 1 - Age of First Use: 19 1 - Amount (size/oz): $120-140 weekly 1 - Frequency: 1-2 x weekly 1 - Duration: past year 1 - Last Use / Amount: yesterday  CIWA:   COWS:    Allergies: No Known Allergies  Home Medications: (Not in a hospital admission)   OB/GYN Status:  No LMP for male patient.  General Assessment Data [facility] of Assessment[facility] TTS Assessment: In system Is this a Tele or Face-to-Face Assessment?: Face-to-Face Is this an Initial Assessment or a Re-assessment for this encounter?: Initial Assessment Patient Accompanied by:: N/A Language Other than English: No Living Arrangements: Other (Comment)(has own home) What gender do you identify as?: Male Marital status: Divorced Living Arrangements: Children Can pt return to current living arrangement?: Yes Admission Status: Voluntary Is patient capable of signing voluntary admission?: Yes Referral Source: Self/Family/Friend Insurance type: [facility]  Medical Screening Exam ([place]) Medical Exam  completed: Yes  Crisis Care Plan Living Arrangements: Children Legal Guardian: Other:(self) Name of Psychiatrist: none Name of Therapist: none  Education Status Is patient currently in school?: No Is the patient employed, unemployed or receiving disability?: Employed  Risk to self with the past 6 months Suicidal Ideation: No Has patient been a risk to self within the past 6 months prior to admission? : No Suicidal Intent: No Has patient had any suicidal intent within the past 6 months prior to admission? : No Is patient at risk for suicide?: No Suicidal Plan?: No Has patient had any suicidal plan within the past 6 months prior to admission? : No Access to Means: No What has been your use of drugs/alcohol within the last 12 months?: cocaine 1-2 x weekly Previous Attempts/Gestures: No How many times?: 0 Other Self Harm Risks: (none) Triggers for Past Attempts: None known Intentional Self Injurious Behavior: None Family Suicide History: No Recent stressful life event(s): Divorce Persecutory voices/beliefs?: No Depression: Yes Depression Symptoms: Isolating, Guilt Substance abuse history and/or treatment for substance abuse?: Yes Suicide prevention information given to non-admitted patients: Not applicable  Risk to Others within the past 6 months Homicidal Ideation: No Does patient have any lifetime risk of violence toward others beyond the six months prior to admission? : No Thoughts of Harm to Others: No Current Homicidal Intent: No Current Homicidal Plan: No Access to Homicidal Means: No Identified Victim: none History of harm to others?: No Assessment of Violence: None Noted Violent Behavior Description: none Does patient have access to weapons?: Yes (Comment) Criminal Charges Pending?: No Does patient have a court date: No Is patient on probation?: No  Psychosis Hallucinations: None noted Delusions: None noted  Mental Status Report Appearance/Hygiene:  Disheveled, Poor hygiene Eye Contact: Good Motor Activity: Freedom of movement Speech: Logical/coherent Level of Consciousness: Alert Mood: Depressed Affect: Flat Anxiety Level: Minimal Thought Processes: Coherent, Relevant Judgement: Impaired Orientation: Person, Place, Time, Situation Obsessive Compulsive Thoughts/Behaviors: None  Cognitive Functioning Concentration: Normal Memory: Recent Intact, Remote Intact Is patient IDD: No Insight: Fair Impulse Control: Poor Appetite: Good Have you had any weight changes? : No Change Sleep: No Change Total Hours of Sleep: 7 Vegetative Symptoms: Decreased grooming  ADLScreening [facility] Assessment Services) Patient's cognitive ability adequate to safely complete daily activities?: Yes Patient able to express need for assistance with ADLs?: Yes Independently performs ADLs?: Yes (appropriate for developmental age)  Prior Inpatient Therapy Prior Inpatient Therapy: No  Prior Outpatient Therapy Prior Outpatient Therapy: No Does patient have an ACCT team?: No Does patient have Intensive In-House Services?  : No Does patient have [facility] services? : No Does patient have P4CC services?: No  ADL Screening (condition at time of admission) Patient's cognitive ability adequate to safely complete daily activities?: Yes Is the patient deaf or have difficulty hearing?: No Does the patient have difficulty seeing, even when wearing glasses/contacts?: No Does the patient have difficulty concentrating, remembering, or making decisions?: No Patient able to express  need for assistance with ADLs?: Yes Does the patient have difficulty dressing or bathing?: No Independently performs ADLs?: Yes (appropriate for developmental age) Does the patient have difficulty walking or climbing stairs?: No Weakness of Legs: None Weakness of Arms/Hands: None  Home Assistive Devices/Equipment Home Assistive Devices/Equipment: None  Therapy Consults (therapy  consults require a physician order) PT Evaluation Needed: No OT Evalulation Needed: No SLP Evaluation Needed: No Abuse/Neglect Assessment (Assessment to be complete while patient is alone) Abuse/Neglect Assessment Can Be Completed: Yes Physical Abuse: Denies Verbal Abuse: Denies Sexual Abuse: Denies Exploitation of patient/patient's resources: Denies Self-Neglect: Denies Values / Beliefs Cultural Requests During Hospitalization: None Spiritual Requests During Hospitalization: None Consults Spiritual Care Consult Needed: No Social Work Consult Needed: No Merchant navy officer (For Healthcare) Does Patient Have a Medical Advance Directive?: No Would patient like information on creating a medical advance directive?: No - Patient declined Nutrition Screen- [facility] Adult/WL/AP Has the patient recently lost weight without trying?: No Has the patient been eating poorly because of a decreased appetite?: No Malnutrition Screening Tool Score: 0        Disposition: Per Malachy Chamber, NP, patient does not meet inpatient admission criteria and was referred to [facility] Disposition Initial Assessment Completed for this Encounter: Yes Disposition of Patient: Discharge Patient refused recommended treatment: No Mode of transportation if patient is discharged/movement?: Car Patient referred to: Outpatient clinic referral  On Site Evaluation by:   Reviewed with Physician:    Eric Hardy 03/03/2019 4:19 PM

## 2019-03-14 ENCOUNTER — Telehealth: Payer: Self-pay | Admitting: Family Medicine

## 2019-03-14 NOTE — Telephone Encounter (Signed)
Left a VM for patient to CB

## 2019-03-14 NOTE — Telephone Encounter (Signed)
Patient can be seen tomorrow.

## 2019-03-14 NOTE — Telephone Encounter (Signed)
Yes

## 2019-03-14 NOTE — Telephone Encounter (Signed)
When would you like to see patient? You do not have any openings right now for tomorrow

## 2019-03-14 NOTE — Telephone Encounter (Signed)
Please advise if you can see him tomorrow? Thanks.

## 2019-03-14 NOTE — Telephone Encounter (Signed)
Patient's R arm is swollen between his elbow and his wrist. He has not had any injuries so he thinks maybe a piece of metal has gotten in to it and it is now infected. Patient would like to make an in person visit for tomorrow. Please advise.

## 2019-03-15 ENCOUNTER — Other Ambulatory Visit: Payer: Self-pay

## 2019-03-15 ENCOUNTER — Ambulatory Visit: Payer: Self-pay | Admitting: Family Medicine

## 2019-03-15 ENCOUNTER — Encounter: Payer: Self-pay | Admitting: Family Medicine

## 2019-03-15 VITALS — BP 128/81 | HR 118 | Temp 97.7°F | Resp 16 | Ht 69.0 in | Wt 279.4 lb

## 2019-03-15 DIAGNOSIS — E7800 Pure hypercholesterolemia, unspecified: Secondary | ICD-10-CM

## 2019-03-15 DIAGNOSIS — E119 Type 2 diabetes mellitus without complications: Secondary | ICD-10-CM

## 2019-03-15 DIAGNOSIS — I10 Essential (primary) hypertension: Secondary | ICD-10-CM

## 2019-03-15 DIAGNOSIS — L02413 Cutaneous abscess of right upper limb: Secondary | ICD-10-CM

## 2019-03-15 DIAGNOSIS — S41101A Unspecified open wound of right upper arm, initial encounter: Secondary | ICD-10-CM

## 2019-03-15 MED ORDER — PIOGLITAZONE HCL 15 MG PO TABS: 15.0000 mg | ORAL_TABLET | Freq: Every day | ORAL | 0 refills | Status: AC

## 2019-03-15 MED ORDER — DOXYCYCLINE HYCLATE 100 MG PO CAPS: 100.0000 mg | ORAL_CAPSULE | Freq: Two times a day (BID) | ORAL | 0 refills | Status: AC

## 2019-03-15 MED ORDER — METFORMIN HCL 1000 MG PO TABS: 1000.0000 mg | ORAL_TABLET | Freq: Two times a day (BID) | ORAL | 0 refills | Status: AC

## 2019-03-15 MED ORDER — CEFTRIAXONE SODIUM 1 G IJ SOLR
1.0000 g | Freq: Once | INTRAMUSCULAR | Status: AC
  Administered 2019-03-15: 1 g via INTRAMUSCULAR

## 2019-03-15 MED ORDER — GABAPENTIN 300 MG PO CAPS: 300.0000 mg | ORAL_CAPSULE | Freq: Three times a day (TID) | ORAL | 0 refills | Status: DC

## 2019-03-15 MED ORDER — ATORVASTATIN CALCIUM 20 MG PO TABS: 20.0000 mg | ORAL_TABLET | Freq: Every day | ORAL | 0 refills | Status: AC

## 2019-03-15 MED ORDER — GLIPIZIDE ER 10 MG PO TB24: 10.0000 mg | ORAL_TABLET | Freq: Every day | ORAL | 0 refills | Status: DC

## 2019-03-15 MED ORDER — LISINOPRIL-HYDROCHLOROTHIAZIDE 10-12.5 MG PO TABS: 1.0000 | ORAL_TABLET | Freq: Every day | ORAL | 0 refills | Status: AC

## 2019-03-15 NOTE — Telephone Encounter (Signed)
Noted  

## 2019-03-15 NOTE — Progress Notes (Signed)
OFFICE VISIT  03/15/2019   CC:  Chief Complaint  Patient presents with  . Arm Swelling    right lower arm. puss/drainage. "hole" in arm patient stated. patient has been wrapping arm   HPI:    Patient is a 33 y.o. Caucasian male with DM 2, HTN, and HLD who presents for R forearm swelling. Picked a scab off the ight mid forearm area a week ago and it started turning red, gradually swelling at the site, small pus pocket became visible last 24h and this morning it started draining. No fever.  No malaise.  He has been applying neosporin some.  Has been cleaning it daily and covering it lately. A family member had some old abx around house and he took 4 doses of this total (last couple days). He applied warm compress x /day for a few days. Has never had anything like this before.    He has been out of all of his medications for the last few weeks but says he was compliant with them prior to this.  No home glucose monitoring.   He is working full time in [employer] but does not recall any injury to his arm.  Past Medical History:  Diagnosis Date  . Chronic low back pain    Facet syndrome per Dr. Wynn Banker (2015)--referred to pain mgmt by [facility] 11/2012 and 05/2013.  . Diabetes mellitus (HCC) 2016   HbA1c 7.2%  . GAD (generalized anxiety disorder)   . Hypercholesterolemia    statin started 03/2017  . Hypertension   . Morbid obesity (HCC)    ? pickwickian syndrome  . OSA on CPAP   . Sinus tachycardia 2018    History reviewed. No pertinent surgical history.  Outpatient Medications Prior to Visit  Medication Sig Dispense Refill  . FREESTYLE LITE test strip 1 each daily by Other route.    . Lancets (FREESTYLE) lancets 1 each daily by Other route.    Marland Kitchen atorvastatin (LIPITOR) 20 MG tablet TAKE 1 TABLET BY MOUTH ONCE DAILY 90 tablet 0  . gabapentin (NEURONTIN) 300 MG capsule Take 1 capsule (300 mg total) by mouth 3 (three) times daily. 2 caps po tid 90 capsule 2  . glipiZIDE  (GLUCOTROL XL) 10 MG 24 hr tablet Take 1 tablet (10 mg total) by mouth daily with breakfast. OFFICE VISIT NEEDED 31 tablet 0  . lisinopril-hydrochlorothiazide (ZESTORETIC) 10-12.5 MG tablet Take 1 tablet by mouth once daily 14 tablet 0  . metFORMIN (GLUCOPHAGE) 1000 MG tablet Take 1 tablet (1,000 mg total) by mouth 2 (two) times daily with a meal. OFFICE VISIT NEEDED FOR FURTHER REFILLS 60 tablet 0  . pioglitazone (ACTOS) 15 MG tablet Take 1 tablet (15 mg total) by mouth daily. OFFICE VISIT NEEDED 30 tablet 0   No facility-administered medications prior to visit.     No Known Allergies  ROS As per HPI  PE: Blood pressure 128/81, pulse (!) 118, temperature 97.7 F (36.5 C), temperature source Temporal, resp. rate 16, height 5\' 9"  (1.753 m), weight 279 lb 6 oz (126.7 kg), SpO2 95 %. Gen: Alert, well appearing.  Patient is oriented to person, place, time, and situation. AFFECT: pleasant, lucid thought and speech. R forearm extensor surface with approx 8 cm x 10cm oval area of erythematous skin, mild diffuse subQ swelling in this area, with significant induration but no fluctuation to suggest abscess or cystic pocket. He has a 1 cm central drainage wound with minimal exudate present.  This area is  minimally tender.  No streaking.  LABS:  Lab Results  Component Value Date   TSH 1.476 08/23/2012   Lab Results  Component Value Date   WBC 11.7 (A) 08/23/2012   HGB 15.5 08/23/2012   HCT 46.6 08/23/2012   MCV 87.9 08/23/2012   PLT 234 05/20/2012   Lab Results  Component Value Date   CREATININE 0.92 08/27/2017   BUN 10 08/27/2017   NA 138 08/27/2017   K 4.5 08/27/2017   CL 97 08/27/2017   CO2 32 08/27/2017   Lab Results  Component Value Date   ALT 24 08/27/2017   AST 15 08/27/2017   ALKPHOS 60 08/27/2017   BILITOT 0.5 08/27/2017   Lab Results  Component Value Date   CHOL 132 08/27/2017   Lab Results  Component Value Date   HDL 33.30 (L) 08/27/2017   Lab Results   Component Value Date   LDLCALC 78 08/27/2017   Lab Results  Component Value Date   TRIG 101.0 08/27/2017   Lab Results  Component Value Date   CHOLHDL 4 08/27/2017   Lab Results  Component Value Date   HGBA1C 7.6 (H) 08/27/2017    IMPRESSION AND PLAN:  1) R arm abscess with cellulitis. This is draining slowly now, no I&D indicated at this time. Warm compress 20 min qd-bid recommended, cover with wet to dry gauze dressing daily. No topical med. Wound culture obtained today. Rocephin 1g IM given in office today. Start doxycycline 100 mg bid x 7d. Recheck in office in 2d.  2) DM 2, HTN, HLD:  Noncompliant with f/u and meds (meds out x 3 wks). Will get HbA1c, CMET, FLP, TSH, and CBC at f/u in 2d when he is fasting. Will RF all meds today.  An After Visit Summary was printed and given to the patient.  FOLLOW UP: Return in about 2 days (around 03/17/2019) for f/u abscess.  Signed:  Crissie Sickles, MD           03/15/2019

## 2019-03-15 NOTE — Telephone Encounter (Signed)
Patient has appointment today at 3:45pm with Dr. Anitra Lauth

## 2019-03-17 ENCOUNTER — Ambulatory Visit: Payer: 59 | Admitting: Family Medicine

## 2019-03-18 LAB — WOUND CULTURE
MICRO NUMBER:: 1091044
SPECIMEN QUALITY:: ADEQUATE

## 2019-03-20 ENCOUNTER — Encounter: Payer: Self-pay | Admitting: Family Medicine

## 2019-03-20 ENCOUNTER — Ambulatory Visit: Payer: Self-pay | Admitting: Family Medicine

## 2019-04-06 ENCOUNTER — Telehealth: Payer: Self-pay | Admitting: Family Medicine

## 2019-04-06 NOTE — Telephone Encounter (Signed)
Patient has been scheduled for tomorrow regarding this.

## 2019-04-06 NOTE — Telephone Encounter (Signed)
Patient calling for refill on Doxycycline, he had last filled on 03/15/19  doxycycline (VIBRAMYCIN) 100 MG capsule [[id number]] ENDED  [place] ([facility]), [place]  Thank you

## 2019-04-07 ENCOUNTER — Other Ambulatory Visit: Payer: Self-pay

## 2019-04-07 ENCOUNTER — Ambulatory Visit: Payer: Self-pay | Admitting: Family Medicine

## 2019-04-07 ENCOUNTER — Encounter: Payer: Self-pay | Admitting: Family Medicine

## 2019-04-07 VITALS — BP 132/87 | HR 96 | Temp 98.2°F | Resp 16 | Ht 69.0 in | Wt 274.2 lb

## 2019-04-07 DIAGNOSIS — A4902 Methicillin resistant Staphylococcus aureus infection, unspecified site: Secondary | ICD-10-CM

## 2019-04-07 DIAGNOSIS — E118 Type 2 diabetes mellitus with unspecified complications: Secondary | ICD-10-CM

## 2019-04-07 DIAGNOSIS — J340 Abscess, furuncle and carbuncle of nose: Secondary | ICD-10-CM

## 2019-04-07 DIAGNOSIS — I10 Essential (primary) hypertension: Secondary | ICD-10-CM

## 2019-04-07 DIAGNOSIS — E7800 Pure hypercholesterolemia, unspecified: Secondary | ICD-10-CM

## 2019-04-07 LAB — CBC WITH DIFFERENTIAL/PLATELET
Basophils Absolute: 0.1 10*3/uL (ref 0.0–0.1)
Basophils Relative: 0.5 % (ref 0.0–3.0)
Eosinophils Absolute: 0.1 10*3/uL (ref 0.0–0.7)
Eosinophils Relative: 1.1 % (ref 0.0–5.0)
HCT: 41.9 % (ref 39.0–52.0)
Hemoglobin: 13.9 g/dL (ref 13.0–17.0)
Lymphocytes Relative: 23.6 % (ref 12.0–46.0)
Lymphs Abs: 2.8 10*3/uL (ref 0.7–4.0)
MCHC: 33.2 g/dL (ref 30.0–36.0)
MCV: 87.4 fl (ref 78.0–100.0)
Monocytes Absolute: 0.9 10*3/uL (ref 0.1–1.0)
Monocytes Relative: 7.4 % (ref 3.0–12.0)
Neutro Abs: 8 10*3/uL — ABNORMAL HIGH (ref 1.4–7.7)
Neutrophils Relative %: 67.4 % (ref 43.0–77.0)
Platelets: 364 10*3/uL (ref 150.0–400.0)
RBC: 4.79 Mil/uL (ref 4.22–5.81)
RDW: 12.8 % (ref 11.5–15.5)
WBC: 11.9 10*3/uL — ABNORMAL HIGH (ref 4.0–10.5)

## 2019-04-07 LAB — COMPREHENSIVE METABOLIC PANEL
ALT: 18 U/L (ref 0–53)
AST: 12 U/L (ref 0–37)
Albumin: 4 g/dL (ref 3.5–5.2)
Alkaline Phosphatase: 73 U/L (ref 39–117)
BUN: 13 mg/dL (ref 6–23)
CO2: 30 mEq/L (ref 19–32)
Calcium: 9.4 mg/dL (ref 8.4–10.5)
Chloride: 92 mEq/L — ABNORMAL LOW (ref 96–112)
Creatinine, Ser: 0.82 mg/dL (ref 0.40–1.50)
GFR: 107.18 mL/min (ref >60.00)
Glucose, Bld: 391 mg/dL — ABNORMAL HIGH (ref 70–99)
Potassium: 5.1 mEq/L (ref 3.5–5.1)
Sodium: 132 mEq/L — ABNORMAL LOW (ref 135–145)
Total Bilirubin: 0.7 mg/dL (ref 0.2–1.2)
Total Protein: 8.1 g/dL (ref 6.0–8.3)

## 2019-04-07 LAB — HEMOGLOBIN A1C: Hgb A1c MFr Bld: 10.6 % — ABNORMAL HIGH (ref 4.6–6.5)

## 2019-04-07 MED ORDER — MUPIROCIN 2 % EX OINT: 1.0000 "application " | TOPICAL_OINTMENT | Freq: Three times a day (TID) | CUTANEOUS | 0 refills | Status: DC

## 2019-04-07 NOTE — Progress Notes (Signed)
OFFICE VISIT  04/07/2019   CC:  Chief Complaint  Patient presents with  . Boils on nose   HPI:    Patient is a 33 y.o. Caucasian male with DM, HTN, HLD, and hx of MRSA+ abscess on R arm last month->who presents for "boils on nose".  Onset 1 wk ago soreness and swelling and redness on tip of nose and L nostril, started oozing pus. No fever or malaise.  Recent R arm abscess finished draining---grew MRSA resistant to doxy, bactrim, clinda, and has no pain or sign of cellulitis in the area.  Past Medical History:  Diagnosis Date  . Chronic low back pain    Facet syndrome per Dr. Wynn Banker (2015)--referred to pain mgmt by [facility] 11/2012 and 05/2013.  . Diabetes mellitus (HCC) 2016   HbA1c 7.2%  . GAD (generalized anxiety disorder)   . Hypercholesterolemia    statin started 03/2017  . Hypertension   . Morbid obesity (HCC)    ? pickwickian syndrome  . MRSA infection    R arm abscess/cellulitis 03/15/19  . OSA on CPAP   . Sinus tachycardia 2018    No past surgical history on file.  Outpatient Medications Prior to Visit  Medication Sig Dispense Refill  . atorvastatin (LIPITOR) 20 MG tablet Take 1 tablet (20 mg total) by mouth daily. 90 tablet 0  . FREESTYLE LITE test strip 1 each daily by Other route.    . gabapentin (NEURONTIN) 300 MG capsule Take 1 capsule (300 mg total) by mouth 3 (three) times daily. 2 caps po tid 90 capsule 0  . glipiZIDE (GLUCOTROL XL) 10 MG 24 hr tablet Take 1 tablet (10 mg total) by mouth daily with breakfast. 90 tablet 0  . Lancets (FREESTYLE) lancets 1 each daily by Other route.    Marland Kitchen lisinopril-hydrochlorothiazide (ZESTORETIC) 10-12.5 MG tablet Take 1 tablet by mouth daily. 90 tablet 0  . metFORMIN (GLUCOPHAGE) 1000 MG tablet Take 1 tablet (1,000 mg total) by mouth 2 (two) times daily with a meal. 180 tablet 0  . pioglitazone (ACTOS) 15 MG tablet Take 1 tablet (15 mg total) by mouth daily. 90 tablet 0   No facility-administered medications prior to  visit.     No Known Allergies  ROS As per HPI  PE: Blood pressure 132/87, pulse 96, temperature 98.2 F (36.8 C), temperature source Temporal, resp. rate 16, height 5\' 9"  (1.753 m), weight 274 lb 3.2 oz (124.4 kg), SpO2 99 %. generally AFFECT: pleasant, lucid thought and speech. Face/nose: tip of nose on L side minimally swollen and erythematous-this area is not tender. A more prominent focal swelling with erythema and signif tenderness is noted on distal aspect of nasal septum on L side, with a punctate hole with pus plugging it. Also with nontender swelling on L aspect of philtrum. Upper lip w/out swelling or erythema or tenderness.  Right forearm with 1 cm dry erythematous macule c/w sealed-up recent furuncle.  No surrounding erythema or fluctuance.  Mild induration over the area.  No tenderness to palpation. LABS:    Chemistry      Component Value Date/Time   NA 138 08/27/2017 0929   K 4.5 08/27/2017 0929   CL 97 08/27/2017 0929   CO2 32 08/27/2017 0929   BUN 10 08/27/2017 0929   CREATININE 0.92 08/27/2017 0929   CREATININE 0.99 09/19/2014 1533      Component Value Date/Time   CALCIUM 9.4 08/27/2017 0929   ALKPHOS 60 08/27/2017 0929   AST 15  08/27/2017 0929   ALT 24 08/27/2017 0929   BILITOT 0.5 08/27/2017 0929     Lab Results  Component Value Date   HGBA1C 7.6 (H) 08/27/2017   Lab Results  Component Value Date   CHOL 132 08/27/2017   HDL 33.30 (L) 08/27/2017   LDLCALC 78 08/27/2017   LDLDIRECT 101 (H) 09/19/2014   TRIG 101.0 08/27/2017   CHOLHDL 4 08/27/2017     IMPRESSION AND PLAN:  1) R nasal septum abscess, hx of MRSA on L arm (resistant to doxy, bactrim, clinda as well). I feel like he needs I&D of this->referral to be seen today. I don't think systemic antibiotics are indicated as long as it is drained, but will rx bactroban ointment for topical application.   2) R forearm furuncle, MRSA as noted in #1 above. Healing appropriately, w/out sign of  surrounding cellulitis. Apply bactroban ointment until completely healed/dry.  3) DM 2, HTN: needs f/u A1c, CMET, CBC w/diff->ordered today.  An After Visit Summary was printed and given to the patient.  FOLLOW UP: Return in about 1 week (around 04/14/2019) for f/u MRSA/DM/HTN-in office.  Signed:  Crissie Sickles, MD           04/07/2019

## 2019-04-11 ENCOUNTER — Other Ambulatory Visit: Payer: Self-pay

## 2019-04-11 MED ORDER — GABAPENTIN 300 MG PO CAPS: 300.0000 mg | ORAL_CAPSULE | Freq: Three times a day (TID) | ORAL | 0 refills | Status: DC

## 2019-04-15 ENCOUNTER — Other Ambulatory Visit: Payer: Self-pay

## 2019-04-15 ENCOUNTER — Ambulatory Visit (HOSPITAL_COMMUNITY)
Admission: EM | Admit: 2019-04-15 | Discharge: 2019-04-15 | Disposition: A | Discharge status: Home / Self Care | Payer: Self-pay | Attending: Emergency Medicine | Admitting: Emergency Medicine

## 2019-04-15 ENCOUNTER — Encounter (HOSPITAL_COMMUNITY): Payer: Self-pay | Admitting: *Deleted

## 2019-04-15 DIAGNOSIS — L02811 Cutaneous abscess of head [any part, except face]: Secondary | ICD-10-CM

## 2019-04-15 MED ORDER — DOXYCYCLINE HYCLATE 100 MG PO CAPS: 100.0000 mg | ORAL_CAPSULE | Freq: Two times a day (BID) | ORAL | 0 refills | Status: AC

## 2019-04-15 MED ORDER — CEFTRIAXONE SODIUM 1 G IJ SOLR
INTRAMUSCULAR | Status: AC
  Filled 2019-04-15: qty 10

## 2019-04-15 MED ORDER — CEFTRIAXONE SODIUM 1 G IJ SOLR
1.0000 g | Freq: Once | INTRAMUSCULAR | Status: AC
  Administered 2019-04-15: 16:00:00 1 g via INTRAMUSCULAR

## 2019-04-15 NOTE — ED Triage Notes (Signed)
States started with abscess to right forearm and next to nose approx 1.5 wks ago, which resolved; now c/o 2 abscesses to right posterior neck/head.  Denies fevers or malaise.

## 2019-04-15 NOTE — Discharge Instructions (Signed)
We gave you a shot of rocephin Begin doxycycline twice daily for 10 days Use anti-inflammatories for pain/swelling. You may take up to 800 mg Ibuprofen every 8 hours with food. You may supplement Ibuprofen with Tylenol [phone] every 8 hours.   Follow up in 2-3 days if not seeing any improvement with the above Follow up sooner if developing fevers, lightheaded, dizzy, fatigue, increased pain and swelling

## 2019-04-16 NOTE — ED Provider Notes (Signed)
[place]    CSN: [id number] Arrival date & time: 04/15/19  1426      History   Chief Complaint Chief Complaint  Patient presents with  . Abscess    HPI MASSON NALEPA is a 33 y.o. male history of DM type II, hypertension, sleep apnea, history of MRSA presenting today for evaluation of abscess.  Patient states that ever the past week he has developed increased pain and swelling to the back of his right posterior neck/scalp.  He notes that approximately 1 month ago he had abscess/cellulitis to his forearm and nose.  He was on doxycycline for this and symptoms have significantly improved.  In the past week he has developed similar abscesses to his scalp.  He does note he has been picking at these areas and trying to drain.  He denies any fevers.  Denies lightheadedness or dizziness.  Denies nausea or vomiting.  HPI  Past Medical History:  Diagnosis Date  . Chronic low back pain    Facet syndrome per Dr. Letta Pate (2015)--referred to pain mgmt by [facility] 11/2012 and 05/2013.  . Diabetes mellitus ([facility]) 2016   HbA1c 7.2%  . GAD (generalized anxiety disorder)   . Hypercholesterolemia    statin started 03/2017  . Hypertension   . Morbid obesity ([facility])    ? pickwickian syndrome  . MRSA infection    R arm abscess/cellulitis 03/15/19  . OSA on CPAP   . Sinus tachycardia 2018    Patient Active Problem List   Diagnosis Date Noted  . Hypercholesterolemia   . Essential hypertension 09/19/2014  . Other depressive episodes 09/19/2014  . GAD (generalized anxiety disorder) 09/19/2014  . Back pain 03/20/2013  . Hypertension 02/10/2013  . Metabolic syndrome X 56/21/3086  . Hypertriglyceridemia 02/10/2013  . Lumbar radiculopathy, chronic 11/16/2012  . Morbid obesity ([place]) 11/16/2012  . Generalized anxiety disorder 11/16/2012  . Severe obstructive sleep apnea 08/31/2012    History reviewed. No pertinent surgical history.     Home Medications    Prior to  Admission medications   Medication Sig Start Date End Date Taking? Authorizing Provider  atorvastatin (LIPITOR) 20 MG tablet Take 1 tablet (20 mg total) by mouth daily. 03/15/19   McGowen, Adrian Blackwater, MD  doxycycline (VIBRAMYCIN) 100 MG capsule Take 1 capsule (100 mg total) by mouth 2 (two) times daily for 10 days. 04/15/19 04/25/19  Nashid Pellum C, PA-C  FREESTYLE LITE test strip 1 each daily by Other route. 02/25/17   [provider]  gabapentin (NEURONTIN) 300 MG capsule Take 1 capsule (300 mg total) by mouth 3 (three) times daily. 04/11/19   McGowen, Adrian Blackwater, MD  glipiZIDE (GLUCOTROL XL) 10 MG 24 hr tablet Take 1 tablet (10 mg total) by mouth daily with breakfast. 03/15/19   McGowen, Adrian Blackwater, MD  Lancets (FREESTYLE) lancets 1 each daily by Other route. 02/25/17   [provider]  lisinopril-hydrochlorothiazide (ZESTORETIC) 10-12.5 MG tablet Take 1 tablet by mouth daily. 03/15/19   McGowen, Adrian Blackwater, MD  metFORMIN (GLUCOPHAGE) 1000 MG tablet Take 1 tablet (1,000 mg total) by mouth 2 (two) times daily with a meal. 03/15/19   McGowen, Adrian Blackwater, MD  mupirocin ointment (BACTROBAN) 2 % Apply 1 application topically 3 (three) times daily. 04/07/19   McGowen, Adrian Blackwater, MD  pioglitazone (ACTOS) 15 MG tablet Take 1 tablet (15 mg total) by mouth daily. 03/15/19   McGowen, Adrian Blackwater, MD    Family History Family History  Problem Relation  Age of Onset  . Hypertension Mother   . Hypertension Maternal Grandmother   . Heart attack Maternal Grandfather   . Hypertension Paternal Grandmother   . Arthritis Paternal Grandmother   . Hyperlipidemia Paternal Grandmother   . Hypertension Paternal Grandfather     Social History Social History   Tobacco Use  . Smoking status: Never Smoker  . Smokeless tobacco: Never Used  Substance Use Topics  . Alcohol use: Not Currently  . Drug use: Never     Allergies   Patient has no known allergies.   Review of Systems Review of Systems    Constitutional: Negative for fatigue and fever.  Eyes: Negative for redness, itching and visual disturbance.  Respiratory: Negative for shortness of breath.   Cardiovascular: Negative for chest pain and leg swelling.  Gastrointestinal: Negative for nausea and vomiting.  Musculoskeletal: Negative for arthralgias and myalgias.  Skin: Positive for color change and wound. Negative for rash.  Neurological: Negative for dizziness, syncope, weakness, light-headedness and headaches.     Physical Exam Triage Vital Signs ED Triage Vitals  Enc Vitals Group     BP 04/15/19 1502 (!) 143/93     Pulse Rate 04/15/19 1502 (!) 128     Resp 04/15/19 1502 (!) 22     Temp 04/15/19 1502 98.3 F (36.8 C)     Temp Source 04/15/19 1502 Oral     SpO2 04/15/19 1502 100 %     Weight --      Height --      Head Circumference --      Peak Flow --      Pain Score 04/15/19 1503 6     Pain Loc --      Pain Edu? --      Excl. in GC? --    No data found.  Updated Vital Signs BP (!) 143/93 Comment: states was taken off HTN med approx 1 yr ago  Pulse (!) 128   Temp 98.3 F (36.8 C) (Oral)   Resp (!) 22   SpO2 100%   Visual Acuity Right Eye Distance:   Left Eye Distance:   Bilateral Distance:    Right Eye Near:   Left Eye Near:    Bilateral Near:     Physical Exam Vitals and nursing note reviewed.  Constitutional:      Appearance: He is well-developed.     Comments: No acute distress  HENT:     Head: Normocephalic and atraumatic.     Nose: Nose normal.  Eyes:     Conjunctiva/sclera: Conjunctivae normal.  Neck:     Comments: Full active range of motion of neck Cardiovascular:     Rate and Rhythm: Tachycardia present.  Pulmonary:     Effort: Pulmonary effort is normal. No respiratory distress.  Abdominal:     General: There is no distension.  Musculoskeletal:        General: Normal range of motion.     Cervical back: Neck supple.  Skin:    General: Skin is warm and dry.      Comments: Right posterior scalp near hairline with large area of induration, swelling and erythema, 3 areas with skin peeling and central scabbing.  No fluctuance palpated.  Neurological:     Mental Status: He is alert and oriented to person, place, and time.      UC Treatments / Results  Labs (all labs ordered are listed, but only abnormal results are displayed) Labs Reviewed - No data  to display  EKG   Radiology No results found.  Procedures Procedures (including critical care time)  Medications Ordered in UC Medications  cefTRIAXone (ROCEPHIN) injection 1 g (1 g Intramuscular Given 04/15/19 1548)    Initial Impression / Assessment and Plan / UC Course  I have reviewed the triage vital signs and the nursing notes.  Pertinent labs & imaging results that were available during my care of the patient were reviewed by me and considered in my medical decision making (see chart for details).     Offered I&D given patient's tachycardia and size of abscess although minimal fluctuance palpated.  Patient seemed hesitant to this.  Discussed other options trial of antibiotic therapy for a few days with close follow-up.  Opted for this, currently no fever and no systemic symptoms.  Discussed patient's tachycardia with patient, appears he was significantly tachycardic at his previous visit with his previous abscesses as well.  Will provide 1 g IM Rocephin prior to discharge followed by course of doxycycline.  Advised patient to follow-up in 2 to 3 days or sooner if not improving or worsening as may need I & D if not draining spontaneously.  Discussed strict return precautions. Patient verbalized understanding and is agreeable with plan.  Final Clinical Impressions(s) / UC Diagnoses   Final diagnoses:  Abscess, scalp     Discharge Instructions     We gave you a shot of rocephin Begin doxycycline twice daily for 10 days Use anti-inflammatories for pain/swelling. You may take up to  800 mg Ibuprofen every 8 hours with food. You may supplement Ibuprofen with Tylenol [phone] mg every 8 hours.   Follow up in 2-3 days if not seeing any improvement with the above Follow up sooner if developing fevers, lightheaded, dizzy, fatigue, increased pain and swelling   ED Prescriptions    Medication Sig Dispense Auth. Provider   doxycycline (VIBRAMYCIN) 100 MG capsule Take 1 capsule (100 mg total) by mouth 2 (two) times daily for 10 days. 20 capsule Kylan Veach, [place] C, PA-C     PDMP not reviewed this encounter.   Lew Dawes, [place] 04/16/19 [phone]

## 2019-07-24 ENCOUNTER — Telehealth: Payer: Self-pay

## 2019-07-24 NOTE — Telephone Encounter (Signed)
Patient is requesting doxycycline for MRSA infection on his shoulder sent [facility]

## 2019-07-24 NOTE — Telephone Encounter (Signed)
Patient needs appt for antibiotic prescription.

## 2019-07-25 NOTE — Telephone Encounter (Signed)
Patient advised. Patient said he would call back.

## 2019-11-01 ENCOUNTER — Encounter: Payer: Self-pay | Admitting: Emergency Medicine

## 2019-11-01 ENCOUNTER — Ambulatory Visit
Admission: EM | Admit: 2019-11-01 | Discharge: 2019-11-01 | Disposition: A | Discharge status: Home / Self Care | Payer: Self-pay | Attending: Physician Assistant | Admitting: Physician Assistant

## 2019-11-01 ENCOUNTER — Other Ambulatory Visit: Payer: Self-pay

## 2019-11-01 DIAGNOSIS — L03211 Cellulitis of face: Secondary | ICD-10-CM

## 2019-11-01 MED ORDER — DOXYCYCLINE HYCLATE 100 MG PO CAPS: 100.0000 mg | ORAL_CAPSULE | Freq: Two times a day (BID) | ORAL | 0 refills | Status: AC

## 2019-11-01 MED ORDER — MUPIROCIN 2 % EX OINT: 1.0000 "application " | TOPICAL_OINTMENT | Freq: Three times a day (TID) | CUTANEOUS | 0 refills | Status: AC

## 2019-11-01 NOTE — Discharge Instructions (Signed)
Start doxycycline as directed. Warm compresses. Monitor for spreading redness, warmth, fever, follow up for reevaluation. If having fever, worsening symptoms, eyelid pain/swelling, painful eye movement, go to the emergency department for further evaluation.

## 2019-11-01 NOTE — ED Provider Notes (Signed)
[facility]    CSN: [id number] Arrival date & time: 11/01/19  1908      History   Chief Complaint Chief Complaint  Patient presents with  . Cellulitis    HPI Eric Hardy is a 33 y.o. male.   34 year old male comes in for 3 day history of redness, swelling, pain to the forehead. Denies fever, chills, body aches. Denies spreading redness, warmth. States started out as a pimple. Now with eyelid swelling. Denies pain with eye movement, vision changes, photophobia, eye discharge. States history of MRSA.  Patient with history of DM, uncontrolled.      Past Medical History:  Diagnosis Date  . Chronic low back pain    Facet syndrome per Dr. Wynn Banker (2015)--referred to pain mgmt by [facility] 11/2012 and 05/2013.  . Diabetes mellitus (HCC) 2016   HbA1c 7.2%  . GAD (generalized anxiety disorder)   . Hypercholesterolemia    statin started 03/2017  . Hypertension   . Morbid obesity (HCC)    ? pickwickian syndrome  . MRSA infection    R arm abscess/cellulitis 03/15/19  . OSA on CPAP   . Sinus tachycardia 2018    Patient Active Problem List   Diagnosis Date Noted  . Hypercholesterolemia   . Essential hypertension 09/19/2014  . Other depressive episodes 09/19/2014  . GAD (generalized anxiety disorder) 09/19/2014  . Back pain 03/20/2013  . Hypertension 02/10/2013  . Metabolic syndrome X 02/10/2013  . Hypertriglyceridemia 02/10/2013  . Lumbar radiculopathy, chronic 11/16/2012  . Morbid obesity (HCC) 11/16/2012  . Generalized anxiety disorder 11/16/2012  . Severe obstructive sleep apnea 08/31/2012    History reviewed. No pertinent surgical history.     Home Medications    Prior to Admission medications   Medication Sig Start Date End Date Taking? Authorizing Provider  atorvastatin (LIPITOR) 20 MG tablet Take 1 tablet (20 mg total) by mouth daily. 03/15/19   McGowen, Maryjean Morn, MD  doxycycline (VIBRAMYCIN) 100 MG capsule Take 1 capsule (100 mg  total) by mouth 2 (two) times daily. 11/01/19   Cathie Hoops, Olen Eaves V, PA-C  FREESTYLE LITE test strip 1 each daily by Other route. 02/25/17   [provider]  gabapentin (NEURONTIN) 300 MG capsule Take 1 capsule (300 mg total) by mouth 3 (three) times daily. 04/11/19   McGowen, Maryjean Morn, MD  glipiZIDE (GLUCOTROL XL) 10 MG 24 hr tablet Take 1 tablet (10 mg total) by mouth daily with breakfast. 03/15/19   McGowen, Maryjean Morn, MD  Lancets (FREESTYLE) lancets 1 each daily by Other route. 02/25/17   [provider]  lisinopril-hydrochlorothiazide (ZESTORETIC) 10-12.5 MG tablet Take 1 tablet by mouth daily. 03/15/19   McGowen, Maryjean Morn, MD  metFORMIN (GLUCOPHAGE) 1000 MG tablet Take 1 tablet (1,000 mg total) by mouth 2 (two) times daily with a meal. 03/15/19   McGowen, Maryjean Morn, MD  mupirocin ointment (BACTROBAN) 2 % Apply 1 application topically 3 (three) times daily. 11/01/19   Cathie Hoops, Waylyn Tenbrink V, PA-C  pioglitazone (ACTOS) 15 MG tablet Take 1 tablet (15 mg total) by mouth daily. 03/15/19   McGowen, Maryjean Morn, MD    Family History Family History  Problem Relation Age of Onset  . Hypertension Mother   . Hypertension Maternal Grandmother   . Heart attack Maternal Grandfather   . Hypertension Paternal Grandmother   . Arthritis Paternal Grandmother   . Hyperlipidemia Paternal Grandmother   . Hypertension Paternal Grandfather     Social History Social History  Tobacco Use  . Smoking status: Never Smoker  . Smokeless tobacco: Never Used  Vaping Use  . Vaping Use: Never used  Substance Use Topics  . Alcohol use: Not Currently  . Drug use: Never     Allergies   Patient has no known allergies.   Review of Systems Review of Systems  Reason unable to perform ROS: See HPI as above.     Physical Exam Triage Vital Signs ED Triage Vitals  Enc Vitals Group     BP 11/01/19 1923 137/83     Pulse Rate 11/01/19 1923 (!) 123     Resp 11/01/19 1923 20     Temp 11/01/19 1923 98.9 F (37.2 C)      Temp Source 11/01/19 1923 Oral     SpO2 11/01/19 1923 96 %     Weight --      Height --      Head Circumference --      Peak Flow --      Pain Score 11/01/19 1921 0     Pain Loc --      Pain Edu? --      Excl. in GC? --    No data found.  Updated Vital Signs BP 137/83 (BP Location: Left Arm)   Pulse (!) 123   Temp 98.9 F (37.2 C) (Oral)   Resp 20   SpO2 96%   Visual Acuity Right Eye Distance:   Left Eye Distance:   Bilateral Distance:    Right Eye Near:   Left Eye Near:    Bilateral Near:     Physical Exam Constitutional:      General: He is not in acute distress.    Appearance: Normal appearance. He is well-developed. He is not toxic-appearing or diaphoretic.  HENT:     Head: Normocephalic and atraumatic.     Comments: See picture below. 1.5cm round cellulitis without abscess.  Eyes:     Extraocular Movements: Extraocular movements intact.     Conjunctiva/sclera: Conjunctivae normal.     Pupils: Pupils are equal, round, and reactive to light.     Comments: Bilateral upper eyelid swelling without erythema, warmth, tenderness.   Pulmonary:     Effort: Pulmonary effort is normal. No respiratory distress.     Comments: Speaking in full sentences without difficulty Musculoskeletal:     Cervical back: Normal range of motion and neck supple.  Skin:    General: Skin is warm and dry.  Neurological:     Mental Status: He is alert and oriented to person, place, and time.        UC Treatments / Results  Labs (all labs ordered are listed, but only abnormal results are displayed) Labs Reviewed - No data to display  EKG   Radiology No results found.  Procedures Procedures (including critical care time)  Medications Ordered in UC Medications - No data to display  Initial Impression / Assessment and Plan / UC Course  I have reviewed the triage vital signs and the nursing notes.  Pertinent labs & imaging results that were available during my care of the  patient were reviewed by me and considered in my medical decision making (see chart for details).    Doxycycline as directed. Warm compresses. Patient to follow up with PCP for further management of chronic diseases. Return precautions given.  Final Clinical Impressions(s) / UC Diagnoses   Final diagnoses:  Cellulitis of face    ED Prescriptions    Medication Sig  Dispense Auth. Provider   doxycycline (VIBRAMYCIN) 100 MG capsule Take 1 capsule (100 mg total) by mouth 2 (two) times daily. 20 capsule Cathie Hoops, Shelvie Salsberry V, PA-C   mupirocin ointment (BACTROBAN) 2 % Apply 1 application topically 3 (three) times daily. 15 g Belinda Fisher, PA-C     PDMP not reviewed this encounter.   Belinda Fisher, PA-C 11/01/19 2213

## 2019-11-01 NOTE — ED Notes (Signed)
Patient able to ambulate independently  

## 2019-11-01 NOTE — ED Triage Notes (Addendum)
Pt presents to [facility] for assessment of three days of redness, swelling, and pain to forehead.  Pt states hx of MRSA infections.  Redness, swelling noted to patient's right eye as well.  Denies visual changes. Pt is not currently taking any of his prescribed home medications (x 3 months) due to no insurance.

## 2020-08-20 ENCOUNTER — Ambulatory Visit
Admission: EM | Admit: 2020-08-20 | Discharge: 2020-08-20 | Disposition: A | Discharge status: Home / Self Care | Payer: Self-pay | Attending: Family Medicine | Admitting: Family Medicine

## 2020-08-20 ENCOUNTER — Encounter: Payer: Self-pay | Admitting: Emergency Medicine

## 2020-08-20 ENCOUNTER — Other Ambulatory Visit: Payer: Self-pay

## 2020-08-20 DIAGNOSIS — E1140 Type 2 diabetes mellitus with diabetic neuropathy, unspecified: Secondary | ICD-10-CM

## 2020-08-20 DIAGNOSIS — I10 Essential (primary) hypertension: Secondary | ICD-10-CM

## 2020-08-20 LAB — POCT URINALYSIS DIP (MANUAL ENTRY)
Bilirubin, UA: NEGATIVE
Blood, UA: NEGATIVE
Glucose, UA: NEGATIVE mg/dL
Ketones, POC UA: NEGATIVE mg/dL
Leukocytes, UA: NEGATIVE
Nitrite, UA: NEGATIVE
Protein Ur, POC: 30 mg/dL — AB
Spec Grav, UA: 1.02 (ref 1.010–1.025)
Urobilinogen, UA: 2 E.U./dL — AB
pH, UA: 7.5 (ref 5.0–8.0)

## 2020-08-20 LAB — POCT FASTING CBG KUC MANUAL ENTRY: POCT Glucose (KUC): 117 mg/dL — AB (ref 70–99)

## 2020-08-20 MED ORDER — AMLODIPINE BESYLATE 5 MG PO TABS: 5.0000 mg | ORAL_TABLET | Freq: Every day | ORAL | 0 refills | Status: AC

## 2020-08-20 MED ORDER — GABAPENTIN 300 MG PO CAPS: 300.0000 mg | ORAL_CAPSULE | Freq: Three times a day (TID) | ORAL | 0 refills | Status: AC

## 2020-08-20 MED ORDER — AMLODIPINE BESYLATE 5 MG PO TABS
5.0000 mg | ORAL_TABLET | Freq: Every day | ORAL | 0 refills | Status: DC
Start: 2020-08-20 — End: 2020-08-20

## 2020-08-20 MED ORDER — GLIPIZIDE ER 10 MG PO TB24: 10.0000 mg | ORAL_TABLET | Freq: Every day | ORAL | 0 refills | Status: AC

## 2020-08-20 NOTE — ED Provider Notes (Signed)
[facility]    CSN: [id number] Arrival date & time: 08/20/20  1003      History   Chief Complaint Chief Complaint  Patient presents with  . Foot Pain  . Medication Refill    HPI Eric FRIDMAN is a 35 y.o. male.   HPI  Patient with a history of of uncontrolled diabetes, hypertension, obesity, obstructive sleep apnea presents today with bilateral foot pain.  Patient has been off of his diabetes medication for a year and a half and has not seen his primary care doctor in over a year and a half due to losing his health insurance.  He has a new job and is eligible now for health insurance. He endorses worsening foot pain.  He was treated previously with gabapentin with minimal relief however was unable to achieve improved readings of his diabetes.  Blood sugar today is 117. Denies 3 P's. Patient is also hypertensive on arrival.  Reports discontinued his blood pressure medication sometime ago after he was told by healthcare provider unsure which 1 that his blood pressure was low and he did not need to take the medication. Denies any shortness of breath or chest pain.  Past Medical History:  Diagnosis Date  . Chronic low back pain    Facet syndrome per Dr. Wynn Banker (2015)--referred to pain mgmt by [facility] 11/2012 and 05/2013.  . Diabetes mellitus (HCC) 2016   HbA1c 7.2%  . GAD (generalized anxiety disorder)   . Hypercholesterolemia    statin started 03/2017  . Hypertension   . Morbid obesity (HCC)    ? pickwickian syndrome  . MRSA infection    R arm abscess/cellulitis 03/15/19  . OSA on CPAP   . Sinus tachycardia 2018    Patient Active Problem List   Diagnosis Date Noted  . Hypercholesterolemia   . Essential hypertension 09/19/2014  . Other depressive episodes 09/19/2014  . GAD (generalized anxiety disorder) 09/19/2014  . Back pain 03/20/2013  . Hypertension 02/10/2013  . Metabolic syndrome X 02/10/2013  . Hypertriglyceridemia 02/10/2013  . Lumbar  radiculopathy, chronic 11/16/2012  . Morbid obesity (HCC) 11/16/2012  . Generalized anxiety disorder 11/16/2012  . Severe obstructive sleep apnea 08/31/2012    History reviewed. No pertinent surgical history.     Home Medications    Prior to Admission medications   Medication Sig Start Date End Date Taking? Authorizing Provider  atorvastatin (LIPITOR) 20 MG tablet Take 1 tablet (20 mg total) by mouth daily. 03/15/19   McGowen, Maryjean Morn, MD  doxycycline (VIBRAMYCIN) 100 MG capsule Take 1 capsule (100 mg total) by mouth 2 (two) times daily. 11/01/19   Cathie Hoops, Amy V, PA-C  FREESTYLE LITE test strip 1 each daily by Other route. 02/25/17   [provider]  gabapentin (NEURONTIN) 300 MG capsule Take 1 capsule (300 mg total) by mouth 3 (three) times daily. 04/11/19   McGowen, Maryjean Morn, MD  glipiZIDE (GLUCOTROL XL) 10 MG 24 hr tablet Take 1 tablet (10 mg total) by mouth daily with breakfast. 03/15/19   McGowen, Maryjean Morn, MD  Lancets (FREESTYLE) lancets 1 each daily by Other route. 02/25/17   [provider]  lisinopril-hydrochlorothiazide (ZESTORETIC) 10-12.5 MG tablet Take 1 tablet by mouth daily. 03/15/19   McGowen, Maryjean Morn, MD  metFORMIN (GLUCOPHAGE) 1000 MG tablet Take 1 tablet (1,000 mg total) by mouth 2 (two) times daily with a meal. 03/15/19   McGowen, Maryjean Morn, MD  mupirocin ointment (BACTROBAN) 2 % Apply 1 application topically  3 (three) times daily. 11/01/19   Cathie Hoops, Amy V, PA-C  pioglitazone (ACTOS) 15 MG tablet Take 1 tablet (15 mg total) by mouth daily. 03/15/19   McGowen, Maryjean Morn, MD    Family History Family History  Problem Relation Age of Onset  . Hypertension Mother   . Hypertension Maternal Grandmother   . Heart attack Maternal Grandfather   . Hypertension Paternal Grandmother   . Arthritis Paternal Grandmother   . Hyperlipidemia Paternal Grandmother   . Hypertension Paternal Grandfather     Social History Social History   Tobacco Use  . Smoking status:  Never Smoker  . Smokeless tobacco: Never Used  Vaping Use  . Vaping Use: Never used  Substance Use Topics  . Alcohol use: Not Currently  . Drug use: Never     Allergies   Patient has no known allergies.   Review of Systems Review of Systems Pertinent negatives listed in HPI  Physical Exam Triage Vital Signs ED Triage Vitals  Enc Vitals Group     BP 08/20/20 1107 (!) 150/113     Pulse Rate 08/20/20 1107 85     Resp 08/20/20 1107 19     Temp 08/20/20 1107 97.8 F (36.6 C)     Temp Source 08/20/20 1107 Oral     SpO2 08/20/20 1107 96 %     Weight --      Height --      Head Circumference --      Peak Flow --      Pain Score 08/20/20 1105 4     Pain Loc --      Pain Edu? --      Excl. in GC? --    No data found.  Updated Vital Signs BP (!) 150/113 (BP Location: Left Arm)   Pulse 85   Temp 97.8 F (36.6 C) (Oral)   Resp 19   SpO2 96%   Visual Acuity Right Eye Distance:   Left Eye Distance:   Bilateral Distance:    Right Eye Near:   Left Eye Near:    Bilateral Near:     Physical Exam Constitutional:      Appearance: He is obese. He is not toxic-appearing.  HENT:     Head: Normocephalic and atraumatic.  Cardiovascular:     Rate and Rhythm: Normal rate and regular rhythm.     Pulses:          Dorsalis pedis pulses are 2+ on the right side and 2+ on the left side.  Pulmonary:     Effort: Pulmonary effort is normal.     Breath sounds: No wheezing or rhonchi.  Abdominal:     General: There is distension.  Feet:     Right foot:     Skin integrity: Skin integrity normal.     Toenail Condition: Right toenails are abnormally thick.     Left foot:     Skin integrity: Skin integrity normal.     Toenail Condition: Left toenails are abnormally thick.     Comments: 4th toe nail ingrown (left) with tenderness at the tip of toe  Skin:    General: Skin is warm and dry.     Capillary Refill: Capillary refill takes less than 2 seconds.  Neurological:      General: No focal deficit present.     Mental Status: He is alert.  Psychiatric:        Mood and Affect: Mood normal.  Behavior: Behavior normal.        Thought Content: Thought content normal.        Judgment: Judgment normal.        UC Treatments / Results  Labs (all labs ordered are listed, but only abnormal results are displayed) Labs Reviewed  POCT FASTING CBG [facility] MANUAL ENTRY - Abnormal; Notable for the following components:      Result Value   POCT Glucose ([facility]) 117 (*)    All other components within normal limits  POCT URINALYSIS DIP (MANUAL ENTRY)    EKG   Radiology No results found.  Procedures Procedures (including critical care time)  Medications Ordered in UC Medications - No data to display  Initial Impression / Assessment and Plan / UC Course  I have reviewed the triage vital signs and the nursing notes.  Pertinent labs & imaging results that were available during my care of the patient were reviewed by me and considered in my medical decision making (see chart for details).    Patient presents today with diabetic neuropathy involving bilateral feet. Patient has history of uncontrolled type 2 diabetes and is currently without a primary care provider.  Refilled glipizide, gabapentin, and started on amlodipine as these medications do not require labs. Patient given a list of primary care providers actively excepting new patients.  He was advised to contact my office is listed regardless of current insurance status.  Patient does not check his blood sugar as he reports she is unable to do so fingersticks therefore advised that it was even more important that he secures an appointment for ongoing management of his chronic conditions.  Patient verbalized understanding agreement with plan.  Final Clinical Impressions(s) / UC Diagnoses   Final diagnoses:  Type 2 diabetes mellitus with diabetic neuropathy, unspecified whether long term insulin use (HCC)   Essential hypertension     Discharge Instructions     Start amlodipine 5 mg daily at bedtime for management of your blood pressure.  I have refilled your glipizide take daily with food.  Also refilled her gabapentin for your foot neuropathy pain take 300 mg 3 times daily as needed for foot pain.  Follow-up and schedule with one of the primary care offices listed above as she need ongoing management of your chronic conditions.    ED Prescriptions    Medication Sig Dispense Auth. Provider   glipiZIDE (GLUCOTROL XL) 10 MG 24 hr tablet Take 1 tablet (10 mg total) by mouth daily with breakfast. 90 tablet Bing Neighbors, FNP   gabapentin (NEURONTIN) 300 MG capsule Take 1 capsule (300 mg total) by mouth 3 (three) times daily. 90 capsule Bing Neighbors, FNP   amLODipine (NORVASC) 5 MG tablet  (Status: Discontinued) Take 1 tablet (5 mg total) by mouth daily. 30 tablet Bing Neighbors, FNP   amLODipine (NORVASC) 5 MG tablet Take 1 tablet (5 mg total) by mouth at bedtime. 30 tablet Bing Neighbors, FNP     PDMP not reviewed this encounter.   Bing Neighbors, FNP 08/20/20 1151

## 2020-08-20 NOTE — Discharge Instructions (Addendum)
Start amlodipine 5 mg daily at bedtime for management of your blood pressure.  I have refilled your glipizide take daily with food.  Also refilled her gabapentin for your foot neuropathy pain take 300 mg 3 times daily as needed for foot pain.  Follow-up and schedule with one of the primary care offices listed above as she need ongoing management of your chronic conditions.

## 2020-08-20 NOTE — ED Triage Notes (Signed)
Pt presents with foot pain/ numbness. States feels like hes walking on rocks. States needs refill of diabetic medication. Has not taken on over a year since loss of insurance.

## 2020-12-04 ENCOUNTER — Other Ambulatory Visit: Payer: Self-pay

## 2020-12-04 ENCOUNTER — Encounter: Payer: Self-pay | Admitting: Emergency Medicine

## 2020-12-04 ENCOUNTER — Ambulatory Visit
Admission: EM | Admit: 2020-12-04 | Discharge: 2020-12-04 | Disposition: A | Discharge status: Home / Self Care | Payer: Self-pay | Attending: Family Medicine | Admitting: Family Medicine

## 2020-12-04 DIAGNOSIS — Z20822 Contact with and (suspected) exposure to covid-19: Secondary | ICD-10-CM

## 2020-12-04 DIAGNOSIS — U071 COVID-19: Secondary | ICD-10-CM

## 2020-12-04 MED ORDER — PROMETHAZINE-DM 6.25-15 MG/5ML PO SYRP: 5.0000 mL | ORAL_SOLUTION | Freq: Four times a day (QID) | ORAL | 0 refills | Status: AC | PRN

## 2020-12-04 NOTE — ED Triage Notes (Signed)
Patient requesting a COVID test, weakness, chills, dry cough, home COVID test on Monday was positive.  Denies any OTC meds.

## 2020-12-04 NOTE — ED Provider Notes (Signed)
[facility]    CSN: [id number] Arrival date & time: 12/04/20  1226      History   Chief Complaint Chief Complaint  Patient presents with   Positive COVID test    HPI Eric [facility] is a 35 y.o. male.   Patient presenting today with 2-day history of weakness, chills, dry hacking cough, congestion.  States he took a home test at onset of symptoms which was positive but he does not think he took it right.  He denies trying any medications at this time.  Denies chest pain, shortness of breath, abdominal pain, nausea vomiting or diarrhea.  Son also positive for COVID.  Requesting retesting for work.  No known history of pulmonary disease.   Past Medical History:  Diagnosis Date   Chronic low back pain    Facet syndrome per Dr. Wynn Banker (2015)--referred to pain mgmt by [facility] 11/2012 and 05/2013.   Diabetes mellitus (HCC) 2016   HbA1c 7.2%   GAD (generalized anxiety disorder)    Hypercholesterolemia    statin started 03/2017   Hypertension    Morbid obesity (HCC)    ? pickwickian syndrome   MRSA infection    R arm abscess/cellulitis 03/15/19   OSA on CPAP    Sinus tachycardia 2018    Patient Active Problem List   Diagnosis Date Noted   Hypercholesterolemia    Essential hypertension 09/19/2014   Other depressive episodes 09/19/2014   GAD (generalized anxiety disorder) 09/19/2014   Back pain 03/20/2013   Hypertension 02/10/2013   Metabolic syndrome X 02/10/2013   Hypertriglyceridemia 02/10/2013   Lumbar radiculopathy, chronic 11/16/2012   Morbid obesity (HCC) 11/16/2012   Generalized anxiety disorder 11/16/2012   Severe obstructive sleep apnea 08/31/2012    History reviewed. No pertinent surgical history.     Home Medications    Prior to Admission medications   Medication Sig Start Date End Date Taking? Authorizing Provider  amLODipine (NORVASC) 5 MG tablet Take 1 tablet (5 mg total) by mouth at bedtime. 08/20/20  Yes Bing Neighbors, FNP   FREESTYLE LITE test strip 1 each daily by Other route. 02/25/17  Yes [provider]  promethazine-dextromethorphan (PROMETHAZINE-DM) 6.25-15 MG/5ML syrup Take 5 mLs by mouth 4 (four) times daily as needed for cough. 12/04/20  Yes Particia Nearing, PA-C  atorvastatin (LIPITOR) 20 MG tablet Take 1 tablet (20 mg total) by mouth daily. 03/15/19   McGowen, Maryjean Morn, MD  doxycycline (VIBRAMYCIN) 100 MG capsule Take 1 capsule (100 mg total) by mouth 2 (two) times daily. 11/01/19   Cathie Hoops, Amy V, PA-C  gabapentin (NEURONTIN) 300 MG capsule Take 1 capsule (300 mg total) by mouth 3 (three) times daily. 08/20/20   Bing Neighbors, FNP  glipiZIDE (GLUCOTROL XL) 10 MG 24 hr tablet Take 1 tablet (10 mg total) by mouth daily with breakfast. 08/20/20   Bing Neighbors, FNP  Lancets (FREESTYLE) lancets 1 each daily by Other route. 02/25/17   [provider]  lisinopril-hydrochlorothiazide (ZESTORETIC) 10-12.5 MG tablet Take 1 tablet by mouth daily. 03/15/19   McGowen, Maryjean Morn, MD  metFORMIN (GLUCOPHAGE) 1000 MG tablet Take 1 tablet (1,000 mg total) by mouth 2 (two) times daily with a meal. 03/15/19   McGowen, Maryjean Morn, MD  mupirocin ointment (BACTROBAN) 2 % Apply 1 application topically 3 (three) times daily. 11/01/19   Cathie Hoops, Amy V, PA-C  pioglitazone (ACTOS) 15 MG tablet Take 1 tablet (15 mg total) by mouth daily. 03/15/19  McGowen, Maryjean Morn, MD    Family History Family History  Problem Relation Age of Onset   Hypertension Mother    Hypertension Maternal Grandmother    Heart attack Maternal Grandfather    Hypertension Paternal Grandmother    Arthritis Paternal Grandmother    Hyperlipidemia Paternal Grandmother    Hypertension Paternal Grandfather     Social History Social History   Tobacco Use   Smoking status: Never   Smokeless tobacco: Never  Vaping Use   Vaping Use: Never used  Substance Use Topics   Alcohol use: Not Currently   Drug use: Never     Allergies    Patient has no known allergies.   Review of Systems Review of Systems Per HPI  Physical Exam Triage Vital Signs ED Triage Vitals [12/04/20 1351]  Enc Vitals Group     BP 135/90     Pulse Rate (!) 104     Resp      Temp 98.6 F (37 C)     Temp Source Oral     SpO2 98 %     Weight 285 lb (129.3 kg)     Height 5\' 10"  (1.778 m)     Head Circumference      Peak Flow      Pain Score 0     Pain Loc      Pain Edu?      Excl. in GC?    No data found.  Updated Vital Signs BP 135/90 (BP Location: Left Arm)   Pulse (!) 104   Temp 98.6 F (37 C) (Oral)   Ht 5\' 10"  (1.778 m)   Wt 285 lb (129.3 kg)   SpO2 98%   BMI 40.89 kg/m   Visual Acuity Right Eye Distance:   Left Eye Distance:   Bilateral Distance:    Right Eye Near:   Left Eye Near:    Bilateral Near:     Physical Exam Vitals and nursing note reviewed.  Constitutional:      Appearance: Normal appearance.  HENT:     Head: Atraumatic.     Right Ear: Tympanic membrane normal.     Left Ear: Tympanic membrane normal.     Nose: Rhinorrhea present.     Mouth/Throat:     Mouth: Mucous membranes are moist.     Pharynx: Posterior oropharyngeal erythema present.  Eyes:     Extraocular Movements: Extraocular movements intact.     Conjunctiva/sclera: Conjunctivae normal.  Cardiovascular:     Rate and Rhythm: Normal rate and regular rhythm.  Pulmonary:     Effort: Pulmonary effort is normal. No respiratory distress.     Breath sounds: Normal breath sounds. No wheezing or rales.  Abdominal:     General: Bowel sounds are normal. There is no distension.     Palpations: Abdomen is soft.     Tenderness: There is no abdominal tenderness. There is no guarding.  Musculoskeletal:        General: Normal range of motion.     Cervical back: Normal range of motion and neck supple.  Skin:    General: Skin is warm and dry.  Neurological:     General: No focal deficit present.     Mental Status: He is oriented to person,  place, and time.  Psychiatric:        Mood and Affect: Mood normal.        Thought Content: Thought content normal.        Judgment:  Judgment normal.     UC Treatments / Results  Labs (all labs ordered are listed, but only abnormal results are displayed) Labs Reviewed  NOVEL CORONAVIRUS, NAA    EKG   Radiology No results found.  Procedures Procedures (including critical care time)  Medications Ordered in UC Medications - No data to display  Initial Impression / Assessment and Plan / UC Course  I have reviewed the triage vital signs and the nursing notes.  Pertinent labs & imaging results that were available during my care of the patient were reviewed by me and considered in my medical decision making (see chart for details).     Mildly tachycardic today in triage but otherwise vital signs and exam reassuring.  Home COVID test positive, will recheck today per patient's request for his work.  COVID PCR pending, work note given with quarantine instructions while awaiting his results.  Phenergan DM for cough suppression, supportive medications and home care reviewed.  Return for acutely worsening symptoms.  Final Clinical Impressions(s) / UC Diagnoses   Final diagnoses:  Exposure to COVID-19 virus  COVID-19   Discharge Instructions   None    ED Prescriptions     Medication Sig Dispense Auth. Provider   promethazine-dextromethorphan (PROMETHAZINE-DM) 6.25-15 MG/5ML syrup Take 5 mLs by mouth 4 (four) times daily as needed for cough. 100 mL Particia Nearing, [place]      PDMP not reviewed this encounter.   Particia Nearing, [place] 12/04/20 1800

## 2020-12-05 LAB — NOVEL CORONAVIRUS, NAA: SARS-CoV-2, NAA: DETECTED — AB

## 2020-12-05 LAB — SARS-COV-2, NAA 2 DAY TAT

## 2021-05-04 DEATH — deceased

## 2024-06-09 ENCOUNTER — Other Ambulatory Visit (HOSPITAL_COMMUNITY): Payer: Self-pay
